# Patient Record
Sex: Female | Born: 1955 | Race: White | Hispanic: No | Marital: Single | Smoking: Never smoker
Health system: Southern US, Community
[De-identification: ages and names within clinical notes are randomized; demographics above are authoritative.]

## PROBLEM LIST (undated history)

## (undated) DIAGNOSIS — R1031 Right lower quadrant pain: Secondary | ICD-10-CM

## (undated) DIAGNOSIS — I10 Essential (primary) hypertension: Secondary | ICD-10-CM

## (undated) DIAGNOSIS — E039 Hypothyroidism, unspecified: Secondary | ICD-10-CM

## (undated) DIAGNOSIS — M1990 Unspecified osteoarthritis, unspecified site: Secondary | ICD-10-CM

## (undated) DIAGNOSIS — J302 Other seasonal allergic rhinitis: Secondary | ICD-10-CM

## (undated) DIAGNOSIS — L309 Dermatitis, unspecified: Secondary | ICD-10-CM

## (undated) DIAGNOSIS — E7800 Pure hypercholesterolemia, unspecified: Secondary | ICD-10-CM

## (undated) HISTORY — PX: OTHER SURGICAL HISTORY: SHX169

## (undated) HISTORY — PX: CARPAL TUNNEL RELEASE: SHX101

## (undated) HISTORY — DX: Essential (primary) hypertension: I10

## (undated) HISTORY — PX: THYROID SURGERY: SHX805

## (undated) HISTORY — DX: Other seasonal allergic rhinitis: J30.2

## (undated) HISTORY — DX: Pure hypercholesterolemia, unspecified: E78.00

## (undated) HISTORY — PX: TONSILLECTOMY: SUR1361

---

## 1998-10-16 ENCOUNTER — Encounter: Payer: Self-pay | Admitting: Neurosurgery

## 1998-10-16 ENCOUNTER — Ambulatory Visit (HOSPITAL_COMMUNITY): Admission: RE | Admit: 1998-10-16 | Discharge: 1998-10-16 | Payer: Self-pay | Admitting: Neurosurgery

## 2004-12-20 ENCOUNTER — Ambulatory Visit: Payer: Self-pay | Admitting: Obstetrics and Gynecology

## 2006-01-03 ENCOUNTER — Ambulatory Visit: Payer: Self-pay | Admitting: Obstetrics and Gynecology

## 2007-01-08 ENCOUNTER — Ambulatory Visit: Payer: Self-pay | Admitting: Obstetrics and Gynecology

## 2007-09-07 ENCOUNTER — Ambulatory Visit: Payer: Self-pay | Admitting: Otolaryngology

## 2007-12-04 ENCOUNTER — Ambulatory Visit: Payer: Self-pay | Admitting: Unknown Physician Specialty

## 2008-09-28 ENCOUNTER — Ambulatory Visit: Payer: Self-pay | Admitting: Family Medicine

## 2008-10-04 ENCOUNTER — Ambulatory Visit: Payer: Self-pay | Admitting: Family Medicine

## 2010-03-12 ENCOUNTER — Ambulatory Visit: Payer: Self-pay | Admitting: Family Medicine

## 2011-03-27 ENCOUNTER — Ambulatory Visit: Payer: Self-pay | Admitting: Family Medicine

## 2012-04-14 ENCOUNTER — Ambulatory Visit: Payer: Self-pay | Admitting: Family Medicine

## 2013-01-22 ENCOUNTER — Ambulatory Visit: Payer: Self-pay | Admitting: Unknown Physician Specialty

## 2013-01-25 LAB — PATHOLOGY REPORT

## 2013-05-04 ENCOUNTER — Ambulatory Visit: Payer: Self-pay | Admitting: Family Medicine

## 2014-05-05 ENCOUNTER — Ambulatory Visit: Payer: Self-pay | Admitting: Family Medicine

## 2014-08-18 ENCOUNTER — Encounter: Payer: Self-pay | Admitting: Pulmonary Disease

## 2014-08-18 ENCOUNTER — Ambulatory Visit (INDEPENDENT_AMBULATORY_CARE_PROVIDER_SITE_OTHER): Payer: BLUE CROSS/BLUE SHIELD | Admitting: Pulmonary Disease

## 2014-08-18 ENCOUNTER — Ambulatory Visit: Payer: Self-pay | Admitting: Pulmonary Disease

## 2014-08-18 ENCOUNTER — Encounter (INDEPENDENT_AMBULATORY_CARE_PROVIDER_SITE_OTHER): Payer: Self-pay

## 2014-08-18 VITALS — BP 134/82 | HR 103 | Ht 64.0 in | Wt 154.0 lb

## 2014-08-18 DIAGNOSIS — R0602 Shortness of breath: Secondary | ICD-10-CM

## 2014-08-18 DIAGNOSIS — R9389 Abnormal findings on diagnostic imaging of other specified body structures: Secondary | ICD-10-CM | POA: Insufficient documentation

## 2014-08-18 DIAGNOSIS — R059 Cough, unspecified: Secondary | ICD-10-CM

## 2014-08-18 DIAGNOSIS — R05 Cough: Secondary | ICD-10-CM | POA: Insufficient documentation

## 2014-08-18 DIAGNOSIS — R0600 Dyspnea, unspecified: Secondary | ICD-10-CM

## 2014-08-18 MED ORDER — HYDROCOD POLST-CHLORPHEN POLST 10-8 MG/5ML PO LQCR: 5.0000 mL | Freq: Two times a day (BID) | ORAL | Status: DC | PRN

## 2014-08-18 NOTE — Assessment & Plan Note (Signed)
She describes slight exertional dyspnea over the last several weeks associated with this cough. Her lung exam was abnormal with coarse crackles in the left lung. Her ambulatory oximetry was normal. Considering her lengthy history of recurrent episodes of pneumonia and bronchitis over the years I am first concerned that she could be experiencing another episode of a respiratory infection, but I am concerned about the possibility of either an underlying interstitial lung disease or bronchiectasis.   Plan:  -obtain full pulmonary function testing today -Chest x-ray today  -May need CT chest, we'll follow-up the results of the pulmonary function test and the chest x-ray first

## 2014-08-18 NOTE — Patient Instructions (Signed)
We will arrange a chest x-ray, pulmonary function test, and possibly a CT scan (depending on the two results). We will see you back in clinic next week. Use the Tussionex to help with the cough.  Beware this has a narcotic so you should not take this and drive

## 2014-08-18 NOTE — Progress Notes (Signed)
PFT performed today. 

## 2014-08-18 NOTE — Assessment & Plan Note (Signed)
Believe that part of her cough is related to laryngeal irritation from frequent coughing and she would benefit from voice rest and cough suppression. However, I'm very concerned about her lung exam as she had coarse crackles in the left lung greater than the right. She also describes exertional dyspnea as well as low oxygen saturation measured at home. This bothers me and raises concern for an underlying lung condition.  Plan: -For now we will use test next as needed for cough -Voice rest was encouraged -See below

## 2014-08-18 NOTE — Progress Notes (Signed)
Subjective:    Patient ID: Jill Crawford, female    DOB: [birth year], 59 y.o.   MRN: [id number]  HPI  Chief Complaint  Patient presents with  . Advice Only    Referred by Dr. Elenore Rota for chronic cough X5 months.  Pt c/o worsening sometimes prod cough with yellow mucus, fatigue, lack of appetite, headache.    Jill Crawford has been suffering with a bad cough for the last five months. She said that in [place] she had a sinus infection that led to a respiratory infection.  She went to a physician and was given antibiotics on two separate occassions which only mildly helped.  She was initially given doxycycline and then a zpack and then later bactrim.  She was allergic to the bactrim.  She was also prescribed proair and prednisone which did not help.  She has seen Dr. Delfin Edis with ENT and has been treated with   She says that eating makes the cough worse, especially when she eats her dry cereal in the monring.  Even just eating soup will make it worse.  Bending over makes it worse.  Coughing makes her throw up and urinate when coughing.  She coughs all night. She denies sinus congestion.  She gets a lot of sinus congestion, watery eyes when this happens.  For the last two weeks the cough has been dry but initially she produced mucus.  She gets short of breath and sometimes dizzy when it happens. She said that once she noticed that her oxygen level drops to 88% when this happens.  She has been using an oximeter at home and has seen her O2 as low as 88% lately when not cougihing and not having symptoms.    She works as an Airline pilot.  She has had acid reflux symptoms in the past but she really hasn't had much problems.    She says that water helps her calm her cough down.  No other foods seem to help.    She has repeated episodes of bronchitis and pneumonia over the years. It is often associated with pain in the sternum.  No lungs problems as a child or severe infections.  She says she only  smoked one week in her life.  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    Past Medical History  Diagnosis Date  . Hypertension   . Hypercholesteremia   . Seasonal allergies      Family History  Problem Relation Age of Onset  . Heart disease Father   . Colon cancer Father      History   Social History  . Marital Status: Single    Spouse Name: N/A    Number of Children: N/A  . Years of Education: N/A   Occupational History  . Not on file.   Social History Main Topics  . Smoking status: Never Smoker   . Smokeless tobacco: Never Used     Comment: exposed to secondhand smoke growing up.   . Alcohol Use: Not on file  . Drug Use: Not on file  . Sexual Activity: Not on file   Other Topics Concern  . Not on file   Social History Narrative  . No narrative on file     Allergies  Allergen Reactions  . Levaquin [Levofloxacin In D5w]     itching  . Penicillins     Serum sickness  . Sulfa Antibiotics     Hives, itching, swelling  .  Aspirin     Sensitive in large doses     No outpatient prescriptions prior to visit.   No facility-administered medications prior to visit.      Review of Systems  Constitutional: Positive for fatigue. Negative for fever and unexpected weight change.  HENT: Positive for congestion. Negative for dental problem, ear pain, nosebleeds, postnasal drip, rhinorrhea, sinus pressure, sneezing, sore throat and trouble swallowing.   Eyes: Negative for redness and itching.  Respiratory: Positive for cough. Negative for chest tightness, shortness of breath and wheezing.   Cardiovascular: Negative for palpitations and leg swelling.  Gastrointestinal: Negative for nausea and vomiting.  Genitourinary: Negative for dysuria.  Musculoskeletal: Negative for joint swelling.  Skin: Negative for rash.  Neurological: Negative for headaches.  Hematological: Does not bruise/bleed easily.  Psychiatric/Behavioral: Negative for  dysphoric mood. The patient is not nervous/anxious.        Objective:   Physical Exam Filed Vitals:   08/18/14 1102  BP: 134/82  Pulse: 103  Height:  (1.626 m)  Weight: 154 lb (69.854 kg)  SpO2: 94%   Ambulated 500 feet on room air and O2 saturation was normal  Gen: well appearing, no acute distress HEENT: NCAT, PERRL, EOMi, OP clear, neck supple without masses PULM: Coarse crackles left lung and right base CV: RRR, no mgr, no JVD AB: BS+, soft, nontender, no hsm Ext: warm, no edema, no clubbing, no cyanosis Derm: no rash or skin breakdown Neuro: A&Ox4, CN II-XII intact, strength 5/5 in all 4 extremities  Records from [place] ear nose and throat were reviewed today in clinic       Assessment & Plan:   Cough Believe that part of her cough is related to laryngeal irritation from frequent coughing and she would benefit from voice rest and cough suppression. However, I'm very concerned about her lung exam as she had coarse crackles in the left lung greater than the right. She also describes exertional dyspnea as well as low oxygen saturation measured at home. This bothers me and raises concern for an underlying lung condition.  Plan: -For now we will use test next as needed for cough -Voice rest was encouraged -See below   Dyspnea She describes slight exertional dyspnea over the last several weeks associated with this cough. Her lung exam was abnormal with coarse crackles in the left lung. Her ambulatory oximetry was normal. Considering her lengthy history of recurrent episodes of pneumonia and bronchitis over the years I am first concerned that she could be experiencing another episode of a respiratory infection, but I am concerned about the possibility of either an underlying interstitial lung disease or bronchiectasis.   Plan:  -obtain full pulmonary function testing today -Chest x-ray today  -May need CT chest, we'll follow-up the results of the pulmonary function  test and the chest x-ray first     Updated Medication List Outpatient Encounter Prescriptions as of 08/18/2014  Medication Sig  . Acetylcarnitine HCl (ACETYL L-CARNITINE) 500 MG CAPS Take 1 capsule by mouth daily.  Marland Kitchen albuterol (PROVENTIL HFA;VENTOLIN HFA) 108 (90 BASE) MCG/ACT inhaler Inhale 2 puffs into the lungs every 6 (six) hours as needed for wheezing or shortness of breath.  . Alpha-Lipoic Acid 200 MG CAPS Take 1 capsule by mouth daily.  Marland Kitchen aspirin 81 MG tablet Take 81 mg by mouth daily.  . Calcium Carbonate-Vitamin D (CALCIUM-VITAMIN D) 500-200 MG-UNIT per tablet Take 1 tablet by mouth 2 (two) times daily.  . cetirizine (ZYRTEC) 10 MG tablet Take  10 mg by mouth daily.  . Cholecalciferol (VITAMIN D3) 2000 UNITS TABS Take 1 tablet by mouth daily.  . Coenzyme Q10 (CO Q 10) 100 MG CAPS Take 1 capsule by mouth daily.  . Flaxseed, Linseed, (FLAXSEED OIL) 1000 MG CAPS Take 1 capsule by mouth daily.  . Garlic 1000 MG CAPS Take 1 capsule by mouth daily.  . Grape Seed Extract 100 MG CAPS Take 1 capsule by mouth daily.  Marland Kitchen. L-Lysine 500 MG TABS Take 1 tablet by mouth daily.  Marland Kitchen. levothyroxine (SYNTHROID, LEVOTHROID) 25 MCG tablet Take 25 mcg by mouth daily before breakfast.  . Magnesium 400 MG CAPS Take 1 capsule by mouth daily.  . mometasone (NASONEX) 50 MCG/ACT nasal spray Place 2 sprays into the nose daily.  . Multiple Vitamin (MULTIVITAMIN WITH MINERALS) TABS tablet Take 1 tablet by mouth daily.  . Omega-3 Fatty Acids (FISH OIL) 1200 MG CAPS Take 1 capsule by mouth daily.  Marland Kitchen. omeprazole (PRILOSEC) 40 MG capsule Take 40 mg by mouth 2 (two) times daily.  . rosuvastatin (CRESTOR) 10 MG tablet Take 10 mg by mouth daily.  . traMADol (ULTRAM) 50 MG tablet Take 50 mg by mouth every 6 (six) hours as needed.  . verapamil (VERELAN PM) 240 MG 24 hr capsule Take 240 mg by mouth 2 (two) times daily.  . vitamin C (ASCORBIC ACID) 500 MG tablet Take 500 mg by mouth daily.  . chlorpheniramine-HYDROcodone  (TUSSIONEX PENNKINETIC ER) 10-8 MG/5ML LQCR Take 5 mLs by mouth every 12 (twelve) hours as needed for cough.

## 2014-08-23 ENCOUNTER — Ambulatory Visit (INDEPENDENT_AMBULATORY_CARE_PROVIDER_SITE_OTHER): Payer: BLUE CROSS/BLUE SHIELD | Admitting: Pulmonary Disease

## 2014-08-23 ENCOUNTER — Telehealth: Payer: Self-pay | Admitting: Pulmonary Disease

## 2014-08-23 ENCOUNTER — Encounter: Payer: Self-pay | Admitting: Pulmonary Disease

## 2014-08-23 VITALS — BP 134/78 | HR 96 | Ht 64.0 in | Wt 154.0 lb

## 2014-08-23 DIAGNOSIS — R059 Cough, unspecified: Secondary | ICD-10-CM

## 2014-08-23 DIAGNOSIS — R05 Cough: Secondary | ICD-10-CM

## 2014-08-23 DIAGNOSIS — R0600 Dyspnea, unspecified: Secondary | ICD-10-CM

## 2014-08-23 MED ORDER — FLUTTER DEVI: Status: DC

## 2014-08-23 MED ORDER — TRAMADOL HCL 50 MG PO TABS: 50.0000 mg | ORAL_TABLET | Freq: Four times a day (QID) | ORAL | Status: DC | PRN

## 2014-08-23 NOTE — Assessment & Plan Note (Signed)
Though she had a fairly normal chest x-ray (acute bronchitis noted), and lung function testing her pulmonary exam remains abnormal. Because of her repeated episodes of bronchitis over the years and persistence of her current symptom of cough I worry about the possibility of bronchiectasis. Further, I worry about the possibility of an atypical mycobacterial infection.  Plan: -Obtain sputum for AFB culture 3 as well as bacterial and fungal culture -High-resolution CT chest to look for bronchiectasis -Use tramadol as needed for cough -Voice rest again encourage -Flutter valve twice a day to encourage mucus production -Follow-up 2-6 weeks depending on result of the CT chest

## 2014-08-23 NOTE — Telephone Encounter (Signed)
lmtcb X1 for pt.  Fyi: flutter valve has already been set aside with rx and [facility] form filled out.  [facility] [place] about taking this to [facility] on Thursday afternoon.

## 2014-08-23 NOTE — Progress Notes (Signed)
Subjective:    Patient ID: Jill Crawford, female    DOB: 04/16/1956, 59 y.o.   MRN: [id number]    Synopsis: Jill Crawford [facility] for evaluation of cough and dyspnea.  She had a history of recurrent bronchitis and a very minimal smoking history (less than one week in college).  PFT in 2016 was normal, CXR showed bronchitis.  HPI Chief Complaint  Patient presents with  . Follow-up    review pft.  pt states her sob is the same.     Jill Crawford continues to cough unfortunatley and she says that the dyspnea is unchanged.  She felt a little better after using the tussionex but then the cough returned.  She continues to have mucus production which is yellow and non-bloody.  No fever or chills.    Past Medical History  Diagnosis Date  . Hypertension   . Hypercholesteremia   . Seasonal allergies       Review of Systems     Objective:   Physical Exam  Filed Vitals:   08/23/14 1324  BP: 134/78  Pulse: 96  Height: 5\' 4"  (1.626 m)  Weight: 154 lb (69.854 kg)  SpO2: 94%  RA  Gen: well appearing, no acute distress HEENT: NCAT,  EOMi, OP clear,  PULM: Coarse crackles bilaterally  CV: RRR, no mgr, no JVD AB: BS+, soft, nontender,  Ext: warm, no edema, no clubbing, no cyanosis Derm: no rash or skin breakdown Neuro: A&Ox4, CN II-XII intact, MAEW       Assessment & Plan:   Dyspnea Though she had a fairly normal chest x-ray (acute bronchitis noted), and lung function testing her pulmonary exam remains abnormal. Because of her repeated episodes of bronchitis over the years and persistence of her current symptom of cough I worry about the possibility of bronchiectasis. Further, I worry about the possibility of an atypical mycobacterial infection.  Plan: -Obtain sputum for AFB culture 3 as well as bacterial and fungal culture -High-resolution CT chest to look for bronchiectasis -Use tramadol as needed for cough -Voice rest  again encourage -Flutter valve twice a day to encourage mucus production -Follow-up 2-6 weeks depending on result of the CT chest     Updated Medication List Outpatient Encounter Prescriptions as of 08/23/2014  Medication Sig  . Acetylcarnitine HCl (ACETYL L-CARNITINE) 500 MG CAPS Take 1 capsule by mouth daily.  Marland Kitchen. albuterol (PROVENTIL HFA;VENTOLIN HFA) 108 (90 BASE) MCG/ACT inhaler Inhale 2 puffs into the lungs every 6 (six) hours as needed for wheezing or shortness of breath.  . Alpha-Lipoic Acid 200 MG CAPS Take 1 capsule by mouth daily.  Marland Kitchen. aspirin 81 MG tablet Take 81 mg by mouth daily.  . Calcium Carbonate-Vitamin D (CALCIUM-VITAMIN D) 500-200 MG-UNIT per tablet Take 1 tablet by mouth 2 (two) times daily.  . cetirizine (ZYRTEC) 10 MG tablet Take 10 mg by mouth daily.  . chlorpheniramine-HYDROcodone (TUSSIONEX PENNKINETIC ER) 10-8 MG/5ML LQCR Take 5 mLs by mouth every 12 (twelve) hours as needed for cough.  . Cholecalciferol (VITAMIN D3) 2000 UNITS TABS Take 1 tablet by mouth daily.  . Coenzyme Q10 (CO Q 10) 100 MG CAPS Take 1 capsule by mouth daily.  . Flaxseed, Linseed, (FLAXSEED OIL) 1000 MG CAPS Take 1 capsule by mouth daily.  . Garlic 1000 MG CAPS Take 1 capsule by mouth daily.  . Grape Seed Extract 100 MG CAPS Take 1 capsule by mouth daily.  Marland Kitchen. L-Lysine 500  MG TABS Take 1 tablet by mouth daily.  Marland Kitchen levothyroxine (SYNTHROID, LEVOTHROID) 25 MCG tablet Take 25 mcg by mouth daily before breakfast.  . Magnesium 400 MG CAPS Take 1 capsule by mouth daily.  . mometasone (NASONEX) 50 MCG/ACT nasal spray Place 2 sprays into the nose daily.  . Multiple Vitamin (MULTIVITAMIN WITH MINERALS) TABS tablet Take 1 tablet by mouth daily.  . Omega-3 Fatty Acids (FISH OIL) 1200 MG CAPS Take 1 capsule by mouth daily.  Marland Kitchen omeprazole (PRILOSEC) 40 MG capsule Take 40 mg by mouth 2 (two) times daily.  . rosuvastatin (CRESTOR) 10 MG tablet Take 10 mg by mouth daily.  . traMADol (ULTRAM) 50 MG tablet Take 50  mg by mouth every 6 (six) hours as needed.  . verapamil (VERELAN PM) 240 MG 24 hr capsule Take 240 mg by mouth 2 (two) times daily.  . vitamin C (ASCORBIC ACID) 500 MG tablet Take 500 mg by mouth daily.  . traMADol (ULTRAM) 50 MG tablet Take 1 tablet (50 mg total) by mouth every 6 (six) hours as needed.

## 2014-08-23 NOTE — Patient Instructions (Signed)
We will order a CT scan of your chest Use the Tramadol as needed for cough Bring us three separate samples of mucus We will see you back in 2 weeks, or 6 weeks if the CT scan is normal

## 2014-08-24 NOTE — Telephone Encounter (Signed)
Message left that  office will have am clinic on Thursday 08/25/14 and not pm clinic. Patient may come by Thurs. Am to pick up flutter device.

## 2014-08-24 NOTE — Telephone Encounter (Signed)
Spoke with pt, she will be picking up flutter valve from [facility] office tomorrow afternoon.  [facility] form [facility]  Forwarding to [facility] just to make her aware.

## 2014-08-24 NOTE — Telephone Encounter (Signed)
pt returned call [phone]

## 2014-08-25 ENCOUNTER — Ambulatory Visit: Payer: Self-pay | Admitting: Pulmonary Disease

## 2014-08-25 NOTE — Telephone Encounter (Signed)
Spoke with pt.  She picked the flutter valve up yesterday and was instructed how to use this by [place].  Pt reports nothing further needed at this time.

## 2014-08-26 LAB — PULMONARY FUNCTION TEST
DL/VA % pred: 107 %
DL/VA: 5.16 ml/min/mmHg/L
DLCO unc % pred: 80 %
DLCO unc: 19.55 ml/min/mmHg
FEF 25-75 PRE: 4.2 L/s
FEF 25-75 Post: 3.71 L/sec
FEF2575-%CHANGE-POST: -11 %
FEF2575-%PRED-POST: 150 %
FEF2575-%Pred-Pre: 170 %
FEV1-%Change-Post: 0 %
FEV1-%PRED-POST: 79 %
FEV1-%Pred-Pre: 79 %
FEV1-PRE: 2.1 L
FEV1-Post: 2.1 L
FEV1FVC-%Change-Post: 0 %
FEV1FVC-%PRED-PRE: 117 %
FEV6-%Change-Post: -1 %
FEV6-%PRED-POST: 67 %
FEV6-%PRED-PRE: 68 %
FEV6-Post: 2.22 L
FEV6-Pre: 2.25 L
FEV6FVC-%PRED-POST: 103 %
FEV6FVC-%PRED-PRE: 103 %
FVC-%CHANGE-POST: 0 %
FVC-%Pred-Post: 66 %
FVC-%Pred-Pre: 66 %
FVC-Post: 2.23 L
FVC-Pre: 2.25 L
PRE FEV1/FVC RATIO: 93 %
PRE FEV6/FVC RATIO: 100 %
Post FEV1/FVC ratio: 94 %
Post FEV6/FVC ratio: 100 %
RV % PRED: 83 %
RV: 1.61 L
TLC % PRED: 83 %
TLC: 4.19 L

## 2014-09-05 ENCOUNTER — Other Ambulatory Visit: Payer: Self-pay | Admitting: Pulmonary Disease

## 2014-09-05 ENCOUNTER — Other Ambulatory Visit: Payer: Self-pay | Admitting: *Deleted

## 2014-09-05 DIAGNOSIS — R059 Cough, unspecified: Secondary | ICD-10-CM

## 2014-09-05 DIAGNOSIS — R05 Cough: Secondary | ICD-10-CM

## 2014-09-07 ENCOUNTER — Telehealth: Payer: Self-pay

## 2014-09-07 NOTE — Telephone Encounter (Signed)
-----   Message from Lupita Leashouglas B McQuaid, MD sent at 09/05/2014  7:32 AM EST ----- A, Please let her know that her CT chest shows some scarring in her lungs and I want her to have my ILD panel (just tell her blood work to figure out what is going on) before the next visit. Thanks B

## 2014-09-07 NOTE — Telephone Encounter (Signed)
Pt aware of results and recs.  Lab appt scheduled for next Friday in [facility] at 8:30.    BQ what labs specifically do you want for her?  Also, pt wants us to mail her a rx for tussionex to last her until this next visit.  Are you ok with this refill?  Thanks!

## 2014-09-08 LAB — RESPIRATORY CULTURE OR RESPIRATORY AND SPUTUM CULTURE
CULTURE: NORMAL
Organism ID, Bacteria: NORMAL

## 2014-09-08 NOTE — Telephone Encounter (Signed)
I'm OK with tussionex refill I was going to order my ILD panel of orders in my favorites.  I thought you have access to that. If not I can order them, but let me know how/where because the ordersets are different for [place] and [place].

## 2014-09-12 ENCOUNTER — Telehealth: Payer: Self-pay | Admitting: Pulmonary Disease

## 2014-09-12 DIAGNOSIS — R05 Cough: Secondary | ICD-10-CM

## 2014-09-12 DIAGNOSIS — R059 Cough, unspecified: Secondary | ICD-10-CM

## 2014-09-12 MED ORDER — TRAMADOL HCL 50 MG PO TABS: 50.0000 mg | ORAL_TABLET | Freq: Four times a day (QID) | ORAL | Status: DC | PRN

## 2014-09-12 MED ORDER — HYDROCOD POLST-CHLORPHEN POLST 10-8 MG/5ML PO LQCR: 5.0000 mL | Freq: Two times a day (BID) | ORAL | Status: DC | PRN

## 2014-09-12 NOTE — Telephone Encounter (Signed)
atc pt, ,line was answered and I was put on hold but line was disconnected.  Wcb.

## 2014-09-12 NOTE — Telephone Encounter (Signed)
OK by me 

## 2014-09-12 NOTE — Telephone Encounter (Signed)
Pt returning call. Please call between 4 & 5. She is in a meeting. Ph #  [phone]

## 2014-09-12 NOTE — Telephone Encounter (Signed)
Spoke with pt, she is aware of recs.  Called med in to pharmacy.  Nothing further needed.

## 2014-09-12 NOTE — Telephone Encounter (Signed)
I can order these labs, I just don't have access to that panel on your favorites.  Dr. Craige CottaSood, since BQ is not in the office are you ok with signing this tussionex refill?  Thanks!

## 2014-09-12 NOTE — Telephone Encounter (Signed)
Patient needs refill of Tramadol.  Patient has appointment on 09/22/2014.  Patient has been taking 2 tablets daily to stretch out the medication, but she only has 2 pills left now which will not last long enough.  She would like to have 14 tablets to hold her over until her next OV.  Patient would like RX mailed to her.

## 2014-09-12 NOTE — Telephone Encounter (Signed)
I can sign script for tussionex.  Please print for me to sign.

## 2014-09-12 NOTE — Telephone Encounter (Signed)
tussionex has been mailed to pt.

## 2014-09-15 ENCOUNTER — Encounter: Payer: Self-pay | Admitting: Pulmonary Disease

## 2014-09-16 ENCOUNTER — Other Ambulatory Visit: Payer: BLUE CROSS/BLUE SHIELD

## 2014-09-16 DIAGNOSIS — R05 Cough: Secondary | ICD-10-CM

## 2014-09-16 DIAGNOSIS — R059 Cough, unspecified: Secondary | ICD-10-CM

## 2014-09-17 LAB — ANTI-JO 1 ANTIBODY, IGG

## 2014-09-17 LAB — RHEUMATOID FACTOR: Rhuematoid fact SerPl-aCnc: 24 IU/mL — ABNORMAL HIGH (ref <=14)

## 2014-09-19 LAB — ANA: Anti Nuclear Antibody(ANA): NEGATIVE

## 2014-09-19 LAB — SJOGRENS SYNDROME-B EXTRACTABLE NUCLEAR ANTIBODY: SSB (LA) (ENA) ANTIBODY, IGG: NEGATIVE

## 2014-09-19 LAB — SJOGRENS SYNDROME-A EXTRACTABLE NUCLEAR ANTIBODY: SSA (RO) (ENA) ANTIBODY, IGG: NEGATIVE

## 2014-09-19 LAB — ANTI-SCLERODERMA ANTIBODY: Scleroderma (Scl-70) (ENA) Antibody, IgG: <1

## 2014-09-19 LAB — CENTROMERE ANTIBODIES: CENTROMERE AB SCREEN: NEGATIVE

## 2014-09-19 LAB — ALDOLASE: Aldolase: 6.9 U/L (ref <=8.1)

## 2014-09-22 ENCOUNTER — Other Ambulatory Visit: Payer: BLUE CROSS/BLUE SHIELD

## 2014-09-22 ENCOUNTER — Encounter: Payer: Self-pay | Admitting: Pulmonary Disease

## 2014-09-22 ENCOUNTER — Ambulatory Visit (INDEPENDENT_AMBULATORY_CARE_PROVIDER_SITE_OTHER): Payer: BLUE CROSS/BLUE SHIELD | Admitting: Pulmonary Disease

## 2014-09-22 VITALS — BP 116/68 | HR 89 | Ht 64.0 in | Wt 151.0 lb

## 2014-09-22 DIAGNOSIS — J849 Interstitial pulmonary disease, unspecified: Secondary | ICD-10-CM

## 2014-09-22 DIAGNOSIS — R05 Cough: Secondary | ICD-10-CM

## 2014-09-22 DIAGNOSIS — R0600 Dyspnea, unspecified: Secondary | ICD-10-CM

## 2014-09-22 DIAGNOSIS — R059 Cough, unspecified: Secondary | ICD-10-CM

## 2014-09-22 MED ORDER — GABAPENTIN 100 MG PO CAPS: ORAL_CAPSULE | ORAL | Status: DC

## 2014-09-22 MED ORDER — HYDROCOD POLST-CHLORPHEN POLST 10-8 MG/5ML PO LQCR: 5.0000 mL | Freq: Two times a day (BID) | ORAL | Status: DC | PRN

## 2014-09-22 NOTE — Progress Notes (Signed)
Subjective:    Patient ID: Jill Crawford, female    DOB: December 27, 1955, 59 y.o.   MRN: [id number]    Synopsis: Jill Crawford was referred to the [facility] pulmonary clinic in 2016 for evaluation of cough and dyspnea.  She had a history of recurrent bronchitis and a very minimal smoking history (less than one week in college).  PFT in 2016 was normal, CXR showed bronchitis.  08/2014 CT chest ([place] read)> patchy areas of peribronchovascular thickening and ggo worrisome for NSIP, doesn't look like UIP but there is a strong cranio-caudal gradient, mild bronchiectasis   HPI Chief Complaint  Patient presents with  . Follow-up    review ct chest.  pt c/o stable sob, dizziness.     Jill Crawford is still coughing up green to yellow phlegm which comes up from time to time.  She says that she is not better since I have met her.  The tussionex and tramadol help with the cough but she thinks that the medications are making her dizzy.  She has fatigue if she does too much.  No fever or chills.  No trouble swallowing, never coughs up blood.  She will get a bloody nose from time to time.  She will get sinus congestion from time to time.  And an occasional nose bleed from time to time.  She does not feel chest pain or significant dyspnea.    Past Medical History  Diagnosis Date  . Hypertension   . Hypercholesteremia   . Seasonal allergies      Review of Systems  Constitutional: Positive for fatigue. Negative for fever and chills.  HENT: Negative for rhinorrhea, sinus pressure and sneezing.   Respiratory: Positive for cough and shortness of breath. Negative for wheezing.   Cardiovascular: Negative for chest pain, palpitations and leg swelling.       Objective:   Physical Exam  Filed Vitals:   09/22/14 1338  BP: 116/68  Pulse: 89  Height: '5\' 4"'  (1.626 m)  Weight: 151 lb (68.493 kg)  SpO2: 94%  RA  Gen: well appearing, no acute distress HEENT: NCAT,  EOMi, OP clear,  PULM: Coarse  crackles bilaterally in bases CV: RRR, no mgr, no JVD AB: BS+, soft, nontender,  Ext: warm, no edema, no clubbing, no cyanosis Derm: no rash or skin breakdown Neuro: A&Ox4, CN II-XII intact, MAEW       Assessment & Plan:   Cough Sky remains quite frustrated with her cough. I don't think she's made much effort and voice rest. I remain concerned that there is an underlying pulmonary disease which is contributing to this, see below.  Plan: -continue voice rest -Continue Tesson night when necessary  -continue workup for interstitial lung disease as detailed below.   Dyspnea I have reviewed the images from the CT chest performed in February 2016 which was worrisome for possible nonspecific interstitial pneumonitis. On my review I see scattered tree in bud abnormalities as well as mild interstitial thickening which seems to be worse in the bases of the lungs bilaterally. Generally the differential diagnosis for a CT like this is aspiration, sarcoidosis, or less likely atypical mycobacterial disease. Because she has been producing significant amounts of mucus for the past several months I remain concerned for the possibility of atypical mycobacterial disease and I am awaiting the results of the 3 sputum cultures which we sent last month.  So far her connective tissue disease labs have been negative.  Also, considering the fact she has  persistent sinus symptoms as well as occasional epistaxis I would like to send ANCA testing today to rule out small vessel vasculitis.  Plan: -ANCA  -If mucus culture does not grow out AFB then we will perform a bronchoscopy with transbronchial biopsies and BAL to evaluate for granulomatous disease     Updated Medication List Outpatient Encounter Prescriptions as of 09/22/2014  Medication Sig  . Acetylcarnitine HCl (ACETYL L-CARNITINE) 500 MG CAPS Take 1 capsule by mouth daily.  Marland Kitchen albuterol (PROVENTIL HFA;VENTOLIN HFA) 108 (90 BASE) MCG/ACT inhaler  Inhale 2 puffs into the lungs every 6 (six) hours as needed for wheezing or shortness of breath.  . Alpha-Lipoic Acid 200 MG CAPS Take 1 capsule by mouth daily.  Marland Kitchen aspirin 81 MG tablet Take 81 mg by mouth daily.  . Calcium Carbonate-Vitamin D (CALCIUM-VITAMIN D) 500-200 MG-UNIT per tablet Take 1 tablet by mouth 2 (two) times daily.  . cetirizine (ZYRTEC) 10 MG tablet Take 10 mg by mouth daily.  . chlorpheniramine-HYDROcodone (TUSSIONEX PENNKINETIC ER) 10-8 MG/5ML LQCR Take 5 mLs by mouth every 12 (twelve) hours as needed for cough.  . Cholecalciferol (VITAMIN D3) 2000 UNITS TABS Take 1 tablet by mouth daily.  . Coenzyme Q10 (CO Q 10) 100 MG CAPS Take 1 capsule by mouth daily.  . Flaxseed, Linseed, (FLAXSEED OIL) 1000 MG CAPS Take 1 capsule by mouth daily.  . Garlic 6147 MG CAPS Take 1 capsule by mouth daily.  . Grape Seed Extract 100 MG CAPS Take 1 capsule by mouth daily.  Marland Kitchen L-Lysine 500 MG TABS Take 1 tablet by mouth daily.  Marland Kitchen levothyroxine (SYNTHROID, LEVOTHROID) 25 MCG tablet Take 25 mcg by mouth daily before breakfast.  . Magnesium 400 MG CAPS Take 1 capsule by mouth daily.  . mometasone (NASONEX) 50 MCG/ACT nasal spray Place 2 sprays into the nose daily.  . Multiple Vitamin (MULTIVITAMIN WITH MINERALS) TABS tablet Take 1 tablet by mouth daily.  . Omega-3 Fatty Acids (FISH OIL) 1200 MG CAPS Take 1 capsule by mouth daily.  Marland Kitchen omeprazole (PRILOSEC) 40 MG capsule Take 40 mg by mouth 2 (two) times daily.  Marland Kitchen Respiratory Therapy Supplies (FLUTTER) DEVI Use daily as directed  . rosuvastatin (CRESTOR) 10 MG tablet Take 10 mg by mouth daily.  . traMADol (ULTRAM) 50 MG tablet Take 50 mg by mouth every 6 (six) hours as needed.  . verapamil (VERELAN PM) 240 MG 24 hr capsule Take 240 mg by mouth 2 (two) times daily.  . vitamin C (ASCORBIC ACID) 500 MG tablet Take 500 mg by mouth daily.  . [DISCONTINUED] chlorpheniramine-HYDROcodone (TUSSIONEX PENNKINETIC ER) 10-8 MG/5ML LQCR Take 5 mLs by mouth every 12  (twelve) hours as needed for cough.  . gabapentin (NEURONTIN) 100 MG capsule Take 11m po tid for a week, then 2075mpo tid for a week, then 30033mid until you see me next  . [DISCONTINUED] traMADol (ULTRAM) 50 MG tablet Take 1 tablet (50 mg total) by mouth every 6 (six) hours as needed.

## 2014-09-22 NOTE — Assessment & Plan Note (Signed)
Jill PikesSusan remains quite frustrated with her cough. I don't think she's made much effort and voice rest. I remain concerned that there is an underlying pulmonary disease which is contributing to this, see below.  Plan: -continue voice rest -Continue Tesson night when necessary  -continue workup for interstitial lung disease as detailed below.

## 2014-09-22 NOTE — Patient Instructions (Signed)
Take the neurontin as directed, start with 100mg  three time a day for a week, then increase to 200mg  three times a day for a week, then 300mg  three times a day until you see me Gradually wean yourself off of the tramadol We will see you back in 6 weeks or sooner if needed

## 2014-09-22 NOTE — Assessment & Plan Note (Signed)
I have reviewed the images from the CT chest performed in February 2016 which was worrisome for possible nonspecific interstitial pneumonitis. On my review I see scattered tree in bud abnormalities as well as mild interstitial thickening which seems to be worse in the bases of the lungs bilaterally. Generally the differential diagnosis for a CT like this is aspiration, sarcoidosis, or less likely atypical mycobacterial disease. Because she has been producing significant amounts of mucus for the past several months I remain concerned for the possibility of atypical mycobacterial disease and I am awaiting the results of the 3 sputum cultures which we sent last month.  So far her connective tissue disease labs have been negative.  Also, considering the fact she has persistent sinus symptoms as well as occasional epistaxis I would like to send ANCA testing today to rule out small vessel vasculitis.  Plan: -ANCA  -If mucus culture does not grow out AFB then we will perform a bronchoscopy with transbronchial biopsies and BAL to evaluate for granulomatous disease

## 2014-09-23 ENCOUNTER — Encounter: Payer: Self-pay | Admitting: Pulmonary Disease

## 2014-09-23 LAB — HYPERSENSITIVITY PNUEMONITIS PROFILE

## 2014-09-26 LAB — ANCA SCREEN W REFLEX TITER
Atypical p-ANCA Screen: NEGATIVE
C-ANCA SCREEN: NEGATIVE
p-ANCA Screen: NEGATIVE

## 2014-09-28 ENCOUNTER — Telehealth: Payer: Self-pay

## 2014-09-28 ENCOUNTER — Other Ambulatory Visit (INDEPENDENT_AMBULATORY_CARE_PROVIDER_SITE_OTHER): Payer: BLUE CROSS/BLUE SHIELD

## 2014-09-28 DIAGNOSIS — R05 Cough: Secondary | ICD-10-CM

## 2014-09-28 DIAGNOSIS — R059 Cough, unspecified: Secondary | ICD-10-CM

## 2014-09-28 NOTE — Telephone Encounter (Signed)
Pt aware of results and recs.  [facility]  Lab ordered.  Nothing further needed at this time.

## 2014-09-28 NOTE — Telephone Encounter (Signed)
-----   Message from Lupita Leashouglas B McQuaid, MD sent at 09/27/2014  1:21 PM EDT ----- A, Please let her know that her blood work was OK with the exception of a weakly positive rheumatoid factor.  This is commonly a false positive.  In order to assess further I'd like her to have a anti-ccp antibody. Thanks B

## 2014-09-29 LAB — CYCLIC CITRUL PEPTIDE ANTIBODY, IGG: Cyclic Citrullin Peptide Ab: <2 U/mL (ref 0.0–5.0)

## 2014-09-30 ENCOUNTER — Telehealth: Payer: Self-pay | Admitting: Pulmonary Disease

## 2014-09-30 NOTE — Telephone Encounter (Signed)
A,    Please let her know that this was normal    Thanks    B    ----------------  Pt aware of results.

## 2014-10-03 LAB — FUNGUS CULTURE W SMEAR: SMEAR RESULT: NONE SEEN

## 2014-10-05 ENCOUNTER — Telehealth: Payer: Self-pay | Admitting: Pulmonary Disease

## 2014-10-05 NOTE — Telephone Encounter (Signed)
FYI - Dr. Kendrick FriesMcQuaid [employer] culture cannot be done at lab, had to be sent out.

## 2014-10-06 NOTE — Telephone Encounter (Signed)
OK 

## 2014-10-21 ENCOUNTER — Telehealth: Payer: Self-pay | Admitting: Pulmonary Disease

## 2014-10-21 ENCOUNTER — Telehealth: Payer: Self-pay | Admitting: *Deleted

## 2014-10-21 MED ORDER — GABAPENTIN 300 MG PO CAPS: 300.0000 mg | ORAL_CAPSULE | Freq: Three times a day (TID) | ORAL | Status: DC

## 2014-10-21 NOTE — Telephone Encounter (Signed)
Spoke with the pt  She states that she has increased to gabapentin to 100 mg 3 tabs tid  She needs refill - requesting 300 mg tablets  I was was to sent these in, b/c she states med is helping her cough (has not resolved, but improved a lot) Then she stated that the med was causing her feet to go numb at night, and her hand went number after 30 min of holding her phone  Please advise if you want her to remain on 300 mg tid

## 2014-10-21 NOTE — Telephone Encounter (Signed)
Spoke with Dr Kendrick FriesMcQuaid  He does not think that the med is causing this, they do not sound related  He is okay with sending in the 300 mg tid  Pt to keep planned f/u  Nothing further needed

## 2014-10-21 NOTE — Telephone Encounter (Signed)
Pharmacy sent request for new rx Gabapentin. Original rx was 100mg  gradually increased Pt now on 300 mg. Need to know how it is working. She does have enough to last until ov on 4/20 with BQ.

## 2014-10-23 LAB — AFB CULTURE WITH SMEAR (NOT AT ARMC): Acid Fast Smear: NONE SEEN

## 2014-11-03 ENCOUNTER — Encounter: Payer: Self-pay | Admitting: Pulmonary Disease

## 2014-11-03 ENCOUNTER — Ambulatory Visit (INDEPENDENT_AMBULATORY_CARE_PROVIDER_SITE_OTHER): Payer: BLUE CROSS/BLUE SHIELD | Admitting: Pulmonary Disease

## 2014-11-03 VITALS — BP 124/70 | HR 82 | Ht 64.0 in | Wt 151.0 lb

## 2014-11-03 DIAGNOSIS — R059 Cough, unspecified: Secondary | ICD-10-CM

## 2014-11-03 DIAGNOSIS — R9389 Abnormal findings on diagnostic imaging of other specified body structures: Secondary | ICD-10-CM

## 2014-11-03 DIAGNOSIS — R05 Cough: Secondary | ICD-10-CM | POA: Diagnosis not present

## 2014-11-03 DIAGNOSIS — R938 Abnormal findings on diagnostic imaging of other specified body structures: Secondary | ICD-10-CM | POA: Diagnosis not present

## 2014-11-03 NOTE — Assessment & Plan Note (Signed)
Her cough has improved. It sounds as if at this point she only has laryngeal irritation contributing to the cough. Her lung exam today was completely normal which is a marked improvement. I believe that this started with an atypical infection which led to persistent cough and laryngeal irritation. I have encouraged her to use allergy medications this time of year to prevent sinus symptoms from worsening or cough.   Plan: Because the cough has improved we decided to continue with Neurontin as prescribed at this point and I have encouraged her to wean herself off of the Tussionex because I believe it is decreasing her quality of sleep. However, if the cough has not resolved within the next 2 months then we can perform a bronchoscopy and consider enrolling her in a clinical trial.  Greater than 25 minutes were spent today in direct consultation with the patient and her mother answering questions regarding her cough and the findings from her CT scan.

## 2014-11-03 NOTE — Patient Instructions (Signed)
Wean yourself off the Tussionex over the next few days Continue using the tramadol on an as-needed basis as you're now Continue taking the Neurontin as you are doing Follow-up in 2 months or sooner

## 2014-11-03 NOTE — Assessment & Plan Note (Signed)
She had very mild changes of bronchiolectasis in nonspecific (but very mild interstitial changes) on her CT chest. The setting of no underlying connective tissue disease, completely normal pulmonary function testing, and improving symptoms, I think the likelihood of an underlying lung disease is low.  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  If she does not have improvement in cough over the next 2 months then we can perform a bronchoscopy with BAL to assess again with this infection. However this time considering her clinical improvement I do not think that is necessary.  Plan: -Continue close monitoring with every 6 month PFTs -Plan to repeat high-resolution CT scan in one year to ensure no evidence of interstitial lung disease -If no improvement in cough in 2 months and then perform bronchoscopy with BAL to assess for mycobacterial infection

## 2014-11-03 NOTE — Progress Notes (Signed)
Subjective:    Patient ID: Jill Crawford, female    DOB: 01/09/1956, 59 y.o.   MRN: [id number]    Synopsis: Jill Crawford was [facility] 2016 for evaluation of cough and dyspnea.  She had a history of recurrent bronchitis and a very minimal smoking history (less than one week in college).  PFT in 2016 was normal, CXR showed bronchitis.  08/2014 CT chest ([place] read)> patchy areas of peribronchovascular thickening and ggo worrisome for NSIP, doesn't look like UIP but there is a strong cranio-caudal gradient, mild bronchiectasis   HPI Chief Complaint  Patient presents with  . Follow-up    Pt states gabapentin helps to stop the coughing as well as the tramadol, but it's not completely gone.  Pt also having troubles from seasonal allergies.     Jill Crawford says that she is no better than previous.  However she says that the cough is not as frequent as before but she still has coughing attacks from time to time but they don't last as long as before and they are not as bad.  She still has some fatigue.  She occasionally coughs up mucus but not as bad as before.  Sometimes it is green, sometimes clear.  No fever or chills.    She is taking the neurontin now which has helped. She uses the tramadol a few times from time to time.  She typically uses 1/2 tab every two weeks.  She doesn't cough much at night anymore.  She is taking tussionex.   She is having more sinus congestion which is worse this time of year typically.    Past Medical History  Diagnosis Date  . Hypertension   . Hypercholesteremia   . Seasonal allergies      Review of Systems  Constitutional: Positive for fatigue. Negative for fever and chills.  HENT: Negative for rhinorrhea, sinus pressure and sneezing.   Respiratory: Positive for cough and shortness of breath. Negative for wheezing.   Cardiovascular: Negative for chest pain, palpitations and leg swelling.       Objective:   Physical Exam  Filed Vitals:   11/03/14 0902  BP: 124/70  Pulse: 82  Height: 5\' 4"  (1.626 m)  Weight: 151 lb (68.493 kg)  SpO2: 96%  RA  Gen: well appearing HENT: OP clear, TM's clear, neck supple PULM: CTA B, normal percussion CV: RRR, no mgr, trace edema GI: BS+, soft, nontender Derm: no cyanosis or rash Psyche: normal mood and affect  Images from the 2016 CT chest were reviewed again which showed very mild interstitial changes in the periphery of her lung and very mild bronchiolectasis, tree in bud abnormalities again seen   I reviewed the records from her current care office where she was found to have a hemoglobin of 15      Assessment & Plan:   Abnormal CT scan, chest She had very mild changes of bronchiolectasis in nonspecific (but very mild interstitial changes) on her CT chest. The setting of no underlying connective tissue disease, completely normal pulmonary function testing, and improving symptoms, I think the likelihood of an underlying lung disease is low.  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  If she does not have improvement in cough over the next 2 months then we can perform a bronchoscopy with BAL to assess again  with this infection. However this time considering her clinical improvement I do not think that is necessary.  Plan: -Continue close monitoring with every 6 month PFTs -Plan to repeat high-resolution CT scan in one year to ensure no evidence of interstitial lung disease -If no improvement in cough in 2 months and then perform bronchoscopy with BAL to assess for mycobacterial infection   Cough Her cough has improved. It sounds as if at this point she only has laryngeal irritation contributing to the cough. Her lung exam today was completely normal which is a marked improvement. I believe that  this started with an atypical infection which led to persistent cough and laryngeal irritation. I have encouraged her to use allergy medications this time of year to prevent sinus symptoms from worsening or cough.   Plan: Because the cough has improved we decided to continue with Neurontin as prescribed at this point and I have encouraged her to wean herself off of the Tussionex because I believe it is decreasing her quality of sleep. However, if the cough has not resolved within the next 2 months then we can perform a bronchoscopy and consider enrolling her in a clinical trial.  Greater than 25 minutes were spent today in direct consultation with the patient and her mother answering questions regarding her cough and the findings from her CT scan.     Updated Medication List Outpatient Encounter Prescriptions as of 11/03/2014  Medication Sig  . Acetylcarnitine HCl (ACETYL L-CARNITINE) 500 MG CAPS Take 1 capsule by mouth daily.  Marland Kitchen albuterol (PROVENTIL HFA;VENTOLIN HFA) 108 (90 BASE) MCG/ACT inhaler Inhale 2 puffs into the lungs every 6 (six) hours as needed for wheezing or shortness of breath.  . Alpha-Lipoic Acid 200 MG CAPS Take 1 capsule by mouth daily.  Marland Kitchen aspirin 81 MG tablet Take 81 mg by mouth daily.  . Calcium Carbonate-Vitamin D (CALCIUM-VITAMIN D) 500-200 MG-UNIT per tablet Take 1 tablet by mouth 2 (two) times daily.  . cetirizine (ZYRTEC) 10 MG tablet Take 10 mg by mouth daily.  . chlorpheniramine (CHLOR-TRIMETON) 4 MG tablet Take 4 mg by mouth 2 (two) times daily as needed for allergies.  . chlorpheniramine-HYDROcodone (TUSSIONEX PENNKINETIC ER) 10-8 MG/5ML LQCR Take 5 mLs by mouth every 12 (twelve) hours as needed for cough.  . Cholecalciferol (VITAMIN D3) 2000 UNITS TABS Take 1 tablet by mouth daily.  . Coenzyme Q10 (CO Q 10) 100 MG CAPS Take 1 capsule by mouth daily.  . Flaxseed, Linseed, (FLAXSEED OIL) 1000 MG CAPS Take 1 capsule by mouth daily.  Marland Kitchen gabapentin (NEURONTIN) 300 MG  capsule Take 1 capsule (300 mg total) by mouth 3 (three) times daily.  . Garlic 1000 MG CAPS Take 1 capsule by mouth daily.  . Grape Seed Extract 100 MG CAPS Take 1 capsule by mouth daily.  Marland Kitchen L-Lysine 500 MG TABS Take 1 tablet by mouth daily.  Marland Kitchen levothyroxine (SYNTHROID, LEVOTHROID) 25 MCG tablet Take 25 mcg by mouth daily before breakfast.  . Magnesium 400 MG CAPS Take 1 capsule by mouth daily.  . mometasone (NASONEX) 50 MCG/ACT nasal spray Place 2 sprays into the nose daily.  . Multiple Vitamin (MULTIVITAMIN WITH MINERALS) TABS tablet Take 1 tablet by mouth daily.  . Omega-3 Fatty Acids (FISH OIL) 1200 MG CAPS Take 1 capsule by mouth daily.  Marland Kitchen Respiratory Therapy Supplies (FLUTTER) DEVI Use daily as directed  . rosuvastatin (CRESTOR) 10 MG tablet Take 10 mg by mouth daily.  . traMADol (ULTRAM) 50 MG tablet  Take 50 mg by mouth every 6 (six) hours as needed.  . verapamil (VERELAN PM) 240 MG 24 hr capsule Take 240 mg by mouth 2 (two) times daily.  . vitamin C (ASCORBIC ACID) 500 MG tablet Take 500 mg by mouth daily.  . [DISCONTINUED] gabapentin (NEURONTIN) 100 MG capsule Take  po tid for a week, then  po tid for a week, then  tid until you see me next (Patient not taking: Reported on 11/03/2014)  . [DISCONTINUED] omeprazole (PRILOSEC) 40 MG capsule Take 40 mg by mouth 2 (two) times daily.

## 2014-11-24 ENCOUNTER — Other Ambulatory Visit: Payer: Self-pay

## 2014-11-24 MED ORDER — GABAPENTIN 300 MG PO CAPS: 300.0000 mg | ORAL_CAPSULE | Freq: Three times a day (TID) | ORAL | Status: DC

## 2014-12-14 ENCOUNTER — Encounter: Payer: Self-pay | Admitting: Pulmonary Disease

## 2014-12-14 ENCOUNTER — Other Ambulatory Visit: Payer: Self-pay | Admitting: *Deleted

## 2014-12-14 ENCOUNTER — Ambulatory Visit (INDEPENDENT_AMBULATORY_CARE_PROVIDER_SITE_OTHER): Payer: BLUE CROSS/BLUE SHIELD | Admitting: Pulmonary Disease

## 2014-12-14 VITALS — BP 136/70 | HR 89 | Ht 64.0 in | Wt 160.0 lb

## 2014-12-14 DIAGNOSIS — R05 Cough: Secondary | ICD-10-CM | POA: Diagnosis not present

## 2014-12-14 DIAGNOSIS — R059 Cough, unspecified: Secondary | ICD-10-CM

## 2014-12-14 MED ORDER — GABAPENTIN 300 MG PO CAPS: ORAL_CAPSULE | ORAL | Status: DC

## 2014-12-14 MED ORDER — GABAPENTIN 100 MG PO CAPS: 100.0000 mg | ORAL_CAPSULE | Freq: Every day | ORAL | Status: DC

## 2014-12-14 NOTE — Assessment & Plan Note (Signed)
Jill Crawford has steadily improved but she still has a lingering cough which is occasionally productive of clear mucus. I'm encouraged by the fact that she does not have shortness of breath anymore and she does not have purulent cough. Her lungs sound clear today. So it sounds as if the primary problem at this point is ongoing laryngeal irritation exacerbated from time to time by acid reflux and some postnasal drip.   Plan: Increase gabapentin to 600 mg 3 times a day, she was instructed to do this every 3 weeks time Over-the-counter antiacid therapy GERD diet was reviewed Continue allergic rhinitis regimen as detailed in previous notes

## 2014-12-14 NOTE — Progress Notes (Signed)
Subjective:    Patient ID: Jill SableSusan Denise Crawford, female    DOB: 06/06/1956, 59 y.o.   MRN: [id number]    Synopsis: Jill Crawford was referred to the [facility] [place] pulmonary clinic in 2016 for evaluation of cough and dyspnea.  She had a history of recurrent bronchitis and a very minimal smoking history (less than one week in college).  PFT in 2016 was normal, CXR showed bronchitis.  08/2014 CT chest ([place] read)> patchy areas of peribronchovascular thickening and ggo worrisome for NSIP, doesn't look like UIP but there is a strong cranio-caudal gradient, mild bronchiectasis   HPI Chief Complaint  Patient presents with  . Follow-up    Pt has no breathing complaints.  Does have a prod cough with mostly clear mucus.  States cough is some better but still persistent.    Jill Crawford has been working outside lately and has been better in general.  She continues to cough nearly every day.  Some days are worse than others, but her breathing is much better. About 1 week ago she took tramadol which helped the cough significantly.  She continues to cough up clear phlegm occasionally (10% of the time).  No fever or chills.  No weight loss.  She is taking neurontin 300mg  tid. She thinks that this has really helped.  Her acid reflux is fairly mild but when it flares up it makes the cough worse; she also notes that postnasal drip makes it worse. She continues to take her allergy regimen.  Past Medical History  Diagnosis Date  . Hypertension   . Hypercholesteremia   . Seasonal allergies      Review of Systems  Constitutional: Negative for fever, chills and fatigue.  HENT: Negative for rhinorrhea, sinus pressure and sneezing.   Respiratory: Positive for cough. Negative for shortness of breath and wheezing.   Cardiovascular: Negative for chest pain, palpitations and leg swelling.       Objective:   Physical Exam  Filed Vitals:   12/14/14 1347  BP: 136/70  Pulse: 89  Height: 5\' 4"  (1.626 m)    Weight: 160 lb (72.576 kg)  SpO2: 95%  RA  Gen: well appearing HENT: OP clear, TM's clear, neck supple PULM: CTA B, normal percussion CV: RRR, no mgr, trace edema GI: BS+, soft, nontender Derm: no cyanosis or rash Psyche: normal mood and affect     Assessment & Plan:   Cough Jill Crawford has steadily improved but she still has a lingering cough which is occasionally productive of clear mucus. I'm encouraged by the fact that she does not have shortness of breath anymore and she does not have purulent cough. Her lungs sound clear today. So it sounds as if the primary problem at this point is ongoing laryngeal irritation exacerbated from time to time by acid reflux and some postnasal drip.   Plan: Increase gabapentin to 600 mg 3 times a day, she was instructed to do this every 3 weeks time Over-the-counter antiacid therapy GERD diet was reviewed Continue allergic rhinitis regimen as detailed in previous notes     Updated Medication List Outpatient Encounter Prescriptions as of 12/14/2014  Medication Sig  . Acetylcarnitine HCl (ACETYL L-CARNITINE) 500 MG CAPS Take 1 capsule by mouth daily.  Marland Kitchen. albuterol (PROVENTIL HFA;VENTOLIN HFA) 108 (90 BASE) MCG/ACT inhaler Inhale 2 puffs into the lungs every 6 (six) hours as needed for wheezing or shortness of breath.  . Alpha-Lipoic Acid 200 MG CAPS Take 1 capsule by mouth daily.  Marland Kitchen. aspirin  81 MG tablet Take 81 mg by mouth daily.  . Calcium Carbonate-Vitamin D (CALCIUM-VITAMIN D) 500-200 MG-UNIT per tablet Take 1 tablet by mouth 2 (two) times daily.  . cetirizine (ZYRTEC) 10 MG tablet Take 10 mg by mouth daily.  . chlorpheniramine (CHLOR-TRIMETON) 4 MG tablet Take 4 mg by mouth 2 (two) times daily as needed for allergies.  . chlorpheniramine-HYDROcodone (TUSSIONEX PENNKINETIC ER) 10-8 MG/5ML LQCR Take 5 mLs by mouth every 12 (twelve) hours as needed for cough.  . Cholecalciferol (VITAMIN D3) 2000 UNITS TABS Take 1 tablet by mouth daily.  . Coenzyme  Q10 (CO Q 10) 100 MG CAPS Take 1 capsule by mouth daily.  . Flaxseed, Linseed, (FLAXSEED OIL) 1000 MG CAPS Take 1 capsule by mouth daily.  Marland Kitchen gabapentin (NEURONTIN) 300 MG capsule Take  tid for a week, then  tid for a week, the  tid continuously  . Garlic 1000 MG CAPS Take 1 capsule by mouth daily.  . Grape Seed Extract 100 MG CAPS Take 1 capsule by mouth daily.  Marland Kitchen L-Lysine 500 MG TABS Take 1 tablet by mouth daily.  Marland Kitchen levothyroxine (SYNTHROID, LEVOTHROID) 25 MCG tablet Take 25 mcg by mouth daily before breakfast.  . Magnesium 400 MG CAPS Take 1 capsule by mouth daily.  . mometasone (NASONEX) 50 MCG/ACT nasal spray Place 2 sprays into the nose daily.  . Multiple Vitamin (MULTIVITAMIN WITH MINERALS) TABS tablet Take 1 tablet by mouth daily.  . Omega-3 Fatty Acids (FISH OIL) 1200 MG CAPS Take 1 capsule by mouth daily.  Marland Kitchen Respiratory Therapy Supplies (FLUTTER) DEVI Use daily as directed  . rosuvastatin (CRESTOR) 10 MG tablet Take 10 mg by mouth daily.  . traMADol (ULTRAM) 50 MG tablet Take 50 mg by mouth every 6 (six) hours as needed.  . verapamil (VERELAN PM) 240 MG 24 hr capsule Take 240 mg by mouth 2 (two) times daily.  . vitamin C (ASCORBIC ACID) 500 MG tablet Take 500 mg by mouth daily.  . [DISCONTINUED] gabapentin (NEURONTIN) 300 MG capsule Take 1 capsule (300 mg total) by mouth 3 (three) times daily.   No facility-administered encounter medications on file as of 12/14/2014.

## 2014-12-14 NOTE — Patient Instructions (Signed)
Increase gabapentin to 400 mg 3 times a day for a week, then 500 mg 3 times a day for a week then 600 mg 3 times a day indefinitely Key taking her allergy medications as prescribed Follow the GERD diet we recommended  We will see back in 3 months or sooner if needed

## 2014-12-15 ENCOUNTER — Encounter: Payer: Self-pay | Admitting: Pulmonary Disease

## 2014-12-15 MED ORDER — GABAPENTIN 100 MG PO CAPS: 100.0000 mg | ORAL_CAPSULE | Freq: Every day | ORAL | Status: DC

## 2015-02-22 ENCOUNTER — Encounter: Payer: Self-pay | Admitting: Pulmonary Disease

## 2015-02-22 ENCOUNTER — Telehealth: Payer: Self-pay | Admitting: Pulmonary Disease

## 2015-02-22 ENCOUNTER — Ambulatory Visit (INDEPENDENT_AMBULATORY_CARE_PROVIDER_SITE_OTHER)
Admission: RE | Admit: 2015-02-22 | Discharge: 2015-02-22 | Disposition: A | Discharge status: Home / Self Care | Payer: BLUE CROSS/BLUE SHIELD | Source: Ambulatory Visit | Attending: Pulmonary Disease | Admitting: Pulmonary Disease

## 2015-02-22 ENCOUNTER — Ambulatory Visit (INDEPENDENT_AMBULATORY_CARE_PROVIDER_SITE_OTHER): Payer: BLUE CROSS/BLUE SHIELD | Admitting: Pulmonary Disease

## 2015-02-22 VITALS — BP 144/76 | HR 84 | Ht 64.0 in | Wt 161.0 lb

## 2015-02-22 DIAGNOSIS — R059 Cough, unspecified: Secondary | ICD-10-CM

## 2015-02-22 DIAGNOSIS — R05 Cough: Secondary | ICD-10-CM

## 2015-02-22 DIAGNOSIS — R938 Abnormal findings on diagnostic imaging of other specified body structures: Secondary | ICD-10-CM

## 2015-02-22 DIAGNOSIS — R9389 Abnormal findings on diagnostic imaging of other specified body structures: Secondary | ICD-10-CM

## 2015-02-22 DIAGNOSIS — J849 Interstitial pulmonary disease, unspecified: Secondary | ICD-10-CM

## 2015-02-22 MED ORDER — AZELASTINE HCL 0.1 % NA SOLN: 2.0000 | Freq: Two times a day (BID) | NASAL | Status: DC

## 2015-02-22 NOTE — Patient Instructions (Signed)
Take omeprazole OTC daily Take the astelin nasal spray as prescribed in additon to your other medications. We will schedule a PFT for you in 04/2015 We will see you back in 04/2015

## 2015-02-22 NOTE — Progress Notes (Signed)
Subjective:    Patient ID: Jill Crawford, female    DOB: Nov 15, 1955, 59 y.o.   MRN: [id number]    Synopsis: Jill Crawford was referred to the [facility] pulmonary clinic in 2016 for evaluation of cough and dyspnea.  She had a history of recurrent bronchitis and a very minimal smoking history (less than one week in college).  PFT in 2016 was normal, CXR showed bronchitis.  08/2014 CT chest ([place] read)> patchy areas of peribronchovascular thickening and ggo worrisome for NSIP, doesn't look like UIP but there is a strong cranio-caudal gradient, mild bronchiectasis   HPI Chief Complaint  Patient presents with  . Follow-up    pt states increased gabapentin rx helped with cough until approx 1 month ago.  c/o prod cough with clear-yellow mucus, worse when eating.  Also notes sinus congestion.     Jill Crawford said that about a month ago she started coughing again with mucus production.  She says that Monday she started getting a sore throat, sinus congestion.  No fever or chils.  No shortness of breath, but lots of sinus congestion.  She is staying active, still cutting the grass and stuff.  She wonders if pollen made this worse.  She was exposed to a lot of pollen.  Cough is worse with eating, lying flat, and bending over makes it. She sometimes feels like there is a tightness in her throat.  She is taking: Zyrtec Lloyd Huger med saline rinse Nasonex regularly  Past Medical History  Diagnosis Date  . Hypertension   . Hypercholesteremia   . Seasonal allergies      Review of Systems  Constitutional: Negative for fever, chills and fatigue.  HENT: Negative for rhinorrhea, sinus pressure and sneezing.   Respiratory: Positive for cough. Negative for shortness of breath and wheezing.   Cardiovascular: Negative for chest pain, palpitations and leg swelling.       Objective:   Physical Exam  Filed Vitals:   02/22/15 1016  BP: 144/76  Pulse: 84  Height: 5\' 4"  (1.626 m)  Weight: 161  lb (73.029 kg)  SpO2: 97%  RA  Gen: mildly ill appearing HENT: OP clear, TM's clear, neck supple PULM: Crackles lungs bilaterally, normal percussion CV: RRR, no mgr, trace edema GI: BS+, soft, nontender Derm: no cyanosis or rash Psyche: normal mood and affect       Assessment & Plan:   Cough She is having another flareup of her chronic cough. Because it occurs with eating and when she lies flat think there is a component of acid reflux contributing. However, she also has postnasal drip which is contributing likely due to allergic rhinitis.  Plan: Ramp up allergic rhinitis therapy by adding a nasal anti-histamine in addition to her oral anti-histamines, nasal steroid, and saline rinses regimen Had omeprazole. We checked the interaction list and it turns out that omeprazole does not cause abnormal levels of gabapentin Continue gabapentin 600 mg 3 times a day Check a chest x-ray today to ensure there is no evidence of pneumonia Follow-up in 2 months, if no improvement in cough consider enrollment in clinical trial or adding Elavil.  Abnormal CT scan, chest She had a question of NSIP based on a CT scan this year but she had totally normal lung function testing and no shortness of breath. Therefore I have questioned this diagnosis. However, because of her ongoing cough symptoms and crackles on exam we need to keep a close eye on this.  Plan: Pulmonary function testing  in 2 months Consider repeat CT scanning of the lung based on pulmonary function test results    Updated Medication List Outpatient Encounter Prescriptions as of 02/22/2015  Medication Sig  . Acetylcarnitine HCl (ACETYL L-CARNITINE) 500 MG CAPS Take 1 capsule by mouth daily.  Marland Kitchen albuterol (PROVENTIL HFA;VENTOLIN HFA) 108 (90 BASE) MCG/ACT inhaler Inhale 2 puffs into the lungs every 6 (six) hours as needed for wheezing or shortness of breath.  . Alpha-Lipoic Acid 200 MG CAPS Take 1 capsule by mouth daily.  Marland Kitchen aspirin 81  MG tablet Take 81 mg by mouth daily.  . Calcium Carbonate-Vitamin D (CALCIUM-VITAMIN D) 500-200 MG-UNIT per tablet Take 1 tablet by mouth 2 (two) times daily.  . cetirizine (ZYRTEC) 10 MG tablet Take 10 mg by mouth daily.  . chlorpheniramine (CHLOR-TRIMETON) 4 MG tablet Take 4 mg by mouth 2 (two) times daily as needed for allergies.  . chlorpheniramine-HYDROcodone (TUSSIONEX PENNKINETIC ER) 10-8 MG/5ML LQCR Take 5 mLs by mouth every 12 (twelve) hours as needed for cough.  . Cholecalciferol (VITAMIN D3) 2000 UNITS TABS Take 1 tablet by mouth daily.  . Coenzyme Q10 (CO Q 10) 100 MG CAPS Take 1 capsule by mouth daily.  . Flaxseed, Linseed, (FLAXSEED OIL) 1000 MG CAPS Take 1 capsule by mouth daily.  Marland Kitchen gabapentin (NEURONTIN) 300 MG capsule Take  tid for a week, then  tid for a week, the  tid continuously  . Garlic 1000 MG CAPS Take 1 capsule by mouth daily.  . Grape Seed Extract 100 MG CAPS Take 1 capsule by mouth daily.  Marland Kitchen L-Lysine 500 MG TABS Take 1 tablet by mouth daily.  Marland Kitchen levothyroxine (SYNTHROID, LEVOTHROID) 25 MCG tablet Take 25 mcg by mouth daily before breakfast.  . Magnesium 400 MG CAPS Take 1 capsule by mouth daily.  . mometasone (NASONEX) 50 MCG/ACT nasal spray Place 2 sprays into the nose daily.  . Multiple Vitamin (MULTIVITAMIN WITH MINERALS) TABS tablet Take 1 tablet by mouth daily.  . Omega-3 Fatty Acids (FISH OIL) 1200 MG CAPS Take 1 capsule by mouth daily.  Marland Kitchen Respiratory Therapy Supplies (FLUTTER) DEVI Use daily as directed  . rosuvastatin (CRESTOR) 10 MG tablet Take 10 mg by mouth daily.  . traMADol (ULTRAM) 50 MG tablet Take 50 mg by mouth every 6 (six) hours as needed.  . verapamil (VERELAN PM) 240 MG 24 hr capsule Take 240 mg by mouth 2 (two) times daily.  . vitamin C (ASCORBIC ACID) 500 MG tablet Take 500 mg by mouth daily.  Marland Kitchen azelastine (ASTELIN) 0.1 % nasal spray Place 2 sprays into both nostrils 2 (two) times daily. Use in each nostril as directed  .  [DISCONTINUED] gabapentin (NEURONTIN) 100 MG capsule Take 1 capsule (100 mg total) by mouth daily. (Patient not taking: Reported on 02/22/2015)   No facility-administered encounter medications on file as of 02/22/2015.

## 2015-02-22 NOTE — Assessment & Plan Note (Signed)
She had a question of NSIP based on a CT scan this year but she had totally normal lung function testing and no shortness of breath. Therefore I have questioned this diagnosis. However, because of her ongoing cough symptoms and crackles on exam we need to keep a close eye on this.  Plan: Pulmonary function testing in 2 months Consider repeat CT scanning of the lung based on pulmonary function test results

## 2015-02-22 NOTE — Telephone Encounter (Signed)
Spoke with pt about cxr results, she is requesting tramadol refill.  Pt seen in office today. This is to be called in to [facility] in [place].  BQ are you ok with this refill?  Thanks!

## 2015-02-22 NOTE — Assessment & Plan Note (Addendum)
She is having another flareup of her chronic cough. Because it occurs with eating and when she lies flat think there is a component of acid reflux contributing. However, she also has postnasal drip which is contributing likely due to allergic rhinitis.  Plan: Ramp up allergic rhinitis therapy by adding a nasal anti-histamine in addition to her oral anti-histamines, nasal steroid, and saline rinses regimen Had omeprazole. We checked the interaction list and it turns out that omeprazole does not cause abnormal levels of gabapentin Continue gabapentin 600 mg 3 times a day Check a chest x-ray today to ensure there is no evidence of pneumonia Follow-up in 2 months, if no improvement in cough consider enrollment in clinical trial or adding Elavil.

## 2015-02-24 MED ORDER — TRAMADOL HCL 50 MG PO TABS: 50.0000 mg | ORAL_TABLET | Freq: Four times a day (QID) | ORAL | Status: DC | PRN

## 2015-02-24 NOTE — Telephone Encounter (Signed)
Patient returned call.  She may be reached at [phone].

## 2015-02-24 NOTE — Telephone Encounter (Signed)
Yes but be sure has/ keeps f/u appt

## 2015-02-24 NOTE — Telephone Encounter (Signed)
Spoke with patient- aware that Rx okayed to be refilled.  Aware to keep upcoming appt with BQ 04/27/15 with PFT.  Nothing further needed.

## 2015-02-24 NOTE — Telephone Encounter (Signed)
LM with family member to have patient return call.  

## 2015-02-24 NOTE — Telephone Encounter (Signed)
Sending message to doc of day as it is Friday and pt will be out of med this weekend.   MW are you ok with filling pt's tramadol in BQ's absence? Last filled 09/12/14 #40  Thanks!

## 2015-03-24 ENCOUNTER — Encounter: Payer: Self-pay | Admitting: Pulmonary Disease

## 2015-03-24 NOTE — Telephone Encounter (Signed)
Dr Kendrick Fries, is this pt a candidate for the pneumovax?  Last documented pneumovax was in 2005 Please advise thanks!

## 2015-04-27 ENCOUNTER — Ambulatory Visit (INDEPENDENT_AMBULATORY_CARE_PROVIDER_SITE_OTHER)
Admission: RE | Admit: 2015-04-27 | Discharge: 2015-04-27 | Disposition: A | Discharge status: Home / Self Care | Payer: BLUE CROSS/BLUE SHIELD | Source: Ambulatory Visit | Attending: Pulmonary Disease | Admitting: Pulmonary Disease

## 2015-04-27 ENCOUNTER — Ambulatory Visit (INDEPENDENT_AMBULATORY_CARE_PROVIDER_SITE_OTHER): Payer: BLUE CROSS/BLUE SHIELD | Admitting: Pulmonary Disease

## 2015-04-27 ENCOUNTER — Encounter: Payer: Self-pay | Admitting: Pulmonary Disease

## 2015-04-27 VITALS — BP 122/64 | HR 83 | Ht 66.0 in | Wt 167.6 lb

## 2015-04-27 DIAGNOSIS — R059 Cough, unspecified: Secondary | ICD-10-CM

## 2015-04-27 DIAGNOSIS — R05 Cough: Secondary | ICD-10-CM

## 2015-04-27 DIAGNOSIS — R938 Abnormal findings on diagnostic imaging of other specified body structures: Secondary | ICD-10-CM | POA: Diagnosis not present

## 2015-04-27 DIAGNOSIS — J383 Other diseases of vocal cords: Secondary | ICD-10-CM | POA: Diagnosis not present

## 2015-04-27 DIAGNOSIS — R9389 Abnormal findings on diagnostic imaging of other specified body structures: Secondary | ICD-10-CM

## 2015-04-27 DIAGNOSIS — J849 Interstitial pulmonary disease, unspecified: Secondary | ICD-10-CM

## 2015-04-27 LAB — PULMONARY FUNCTION TEST
DL/VA % PRED: 127 %
DL/VA: 6.14 ml/min/mmHg/L
DLCO unc % pred: 98 %
DLCO unc: 23.92 ml/min/mmHg
FEF 25-75 POST: 4.08 L/s
FEF 25-75 Pre: 3.62 L/sec
FEF2575-%Change-Post: 12 %
FEF2575-%Pred-Post: 167 %
FEF2575-%Pred-Pre: 148 %
FEV1-%Change-Post: 3 %
FEV1-%PRED-PRE: 90 %
FEV1-%Pred-Post: 93 %
FEV1-Post: 2.43 L
FEV1-Pre: 2.36 L
FEV1FVC-%CHANGE-POST: 2 %
FEV1FVC-%Pred-Pre: 112 %
FEV6-%Change-Post: 0 %
FEV6-%Pred-Post: 82 %
FEV6-%Pred-Pre: 82 %
FEV6-PRE: 2.69 L
FEV6-Post: 2.7 L
FEV6FVC-%Pred-Post: 103 %
FEV6FVC-%Pred-Pre: 103 %
FVC-%Change-Post: 0 %
FVC-%PRED-PRE: 80 %
FVC-%Pred-Post: 80 %
FVC-POST: 2.7 L
FVC-PRE: 2.69 L
POST FEV1/FVC RATIO: 90 %
POST FEV6/FVC RATIO: 100 %
PRE FEV1/FVC RATIO: 88 %
Pre FEV6/FVC Ratio: 100 %
RV % pred: 87 %
RV: 1.71 L
TLC % PRED: 88 %
TLC: 4.45 L

## 2015-04-27 MED ORDER — OMEPRAZOLE 40 MG PO CPDR: 40.0000 mg | DELAYED_RELEASE_CAPSULE | Freq: Every day | ORAL | Status: DC

## 2015-04-27 NOTE — Assessment & Plan Note (Signed)
She continues to struggle from cough which is primarily due to irritable larynx syndrome. I think acid reflux makes the symptoms worse as she states that she has worsening cough when she eats and when she bends over. She also has a degree of vocal cord dysfunction.  Plan: Maximize antacid therapy with Prilosec 40 mg daily and Pepcid in the evening Continue gabapentin 600 mg 3 times a day Continue treatment for postnasal drip If no improvement with cough in the next 4 weeks then referred to GI medicine

## 2015-04-27 NOTE — Assessment & Plan Note (Signed)
She had some nonspecific changes on her CT chest earlier this year which was not clearly associated with an inflammatory lung disease. I think that it likely represented an improving infection versus some degree of chronic microaspiration. She continues to have mild crackles in the bases of her lungs but her repeat lung function testing today were completely normal which is reassuring that she does not have any sort of progressive inflammatory lung disease.  Plan: Repeat chest x-ray For now we'll plan on repeating lung function testing on a six-month basis for surveillance

## 2015-04-27 NOTE — Patient Instructions (Signed)
Keep taking the gabapentin as you're doing Take omeprazole 40 mg daily in the morning Take 1 Pepcid AC  in the evening We will call you with the results of today's chest x-ray If you have not had improvement in the cough in about 4 weeks with this and acid regimen then we recommend that you see Dr. Mechele CollinElliott again, call us if you need help with the referral Practice the voice relaxation techniques that I demonstrated today, if you still have problems with vocal cord spasms then we can [place] We will see you back in 2-3 months or sooner if needed

## 2015-04-27 NOTE — Progress Notes (Signed)
PFT done today. 

## 2015-04-27 NOTE — Progress Notes (Signed)
Subjective:    Patient ID: Jill Crawford, female    DOB: Sep 17, 1955, 59 y.o.   MRN: [id number]    Synopsis: Yaretsi Humphres was referred to the [facility] pulmonary clinic in 2016 for evaluation of cough and dyspnea.  She had a history of recurrent bronchitis and a very minimal smoking history (less than one week in college).  PFT in 2016 was normal, CXR showed bronchitis.  08/2014 CT chest ([place] read)> patchy areas of peribronchovascular thickening and ggo worrisome for NSIP, doesn't look like UIP but there is a strong cranio-caudal gradient, mild bronchiectasis October 2016 pulmonary function testing ratio 90%, FEV1 2.43 L (93% predicted) this he is, total lung capacity 4.45 L (88% predicted), DLCO 23.92 (98% predicted).   HPI Chief Complaint  Patient presents with  . Follow-up    pt. had PFT today. pt. states no wheezing. no SOB. no chest pain/tightness. prod. cough clear in color.     Cough still occurs when eating or when she bends over.  Has a tickle in her throat.  No trouble breathing, remaining active in the yard a lot.  Cough is better than when I first met her, but she is really dependent on the gabapentin.  She needs to take it regularly or else. She has a gastroenterologist but hasn't seen him in a while (Dr. Tiffany Kocher at [facility]). She says that she sometimes feels like she gets a "spasm" in her throat which makes the cough worse. Taking tramadol only when she has these bad spasm in her throat.    Past Medical History  Diagnosis Date  . Hypertension   . Hypercholesteremia   . Seasonal allergies      Review of Systems  Constitutional: Negative for fever, chills and fatigue.  HENT: Negative for rhinorrhea, sinus pressure and sneezing.   Respiratory: Positive for cough. Negative for shortness of breath and wheezing.   Cardiovascular: Negative for chest pain, palpitations and leg swelling.       Objective:   Physical Exam  Filed Vitals:   04/27/15 1348  BP: 122/64  Pulse: 83  Height: '5\' 6"'  (1.676 m)  Weight: 167 lb 9.6 oz (76.023 kg)  SpO2: 99%  RA  Gen: mildly ill appearing HENT: OP clear, TM's clear, neck supple PULM: Crackles lungs bilaterally, normal percussion CV: RRR, no mgr, trace edema GI: BS+, soft, nontender Derm: no cyanosis or rash Psyche: normal mood and affect  PFT today reviewed, see summary above     Assessment & Plan:   Abnormal CT scan, chest She had some nonspecific changes on her CT chest earlier this year which was not clearly associated with an inflammatory lung disease. I think that it likely represented an improving infection versus some degree of chronic microaspiration. She continues to have mild crackles in the bases of her lungs but her repeat lung function testing today were completely normal which is reassuring that she does not have any sort of progressive inflammatory lung disease.  Plan: Repeat chest x-ray For now we'll plan on repeating lung function testing on a six-month basis for surveillance  Cough She continues to struggle from cough which is primarily due to irritable larynx syndrome. I think acid reflux makes the symptoms worse as she states that she has worsening cough when she eats and when she bends over. She also has a degree of vocal cord dysfunction.  Plan: Maximize antacid therapy with Prilosec 40 mg daily and Pepcid in the evening Continue gabapentin 600 mg  3 times a day Continue treatment for postnasal drip If no improvement with cough in the next 4 weeks then referred to GI medicine  Vocal cord dysfunction Her lung function testing earlier this year was suggestive of variable upper airway flow obstruction and she has symptoms of intermittent tightness in her throat which sounds consistent with vocal cord dysfunction. Imaging has not shown any sort of tracheal or upper airway abnormality. I think acid reflux may contribute to this to a degree.  Plan: Relaxation  techniques were reviewed at length today in clinic Maximize antacid therapy If no improvement then consider referral to Dr. Wanita Chamberlain in [place].    Updated Medication List Outpatient Encounter Prescriptions as of 04/27/2015  Medication Sig  . Acetylcarnitine HCl (ACETYL L-CARNITINE) 500 MG CAPS Take 1 capsule by mouth daily.  Marland Kitchen albuterol (PROVENTIL HFA;VENTOLIN HFA) 108 (90 BASE) MCG/ACT inhaler Inhale 2 puffs into the lungs every 6 (six) hours as needed for wheezing or shortness of breath.  . Alpha-Lipoic Acid 200 MG CAPS Take 1 capsule by mouth daily.  Marland Kitchen aspirin 81 MG tablet Take 81 mg by mouth daily.  Marland Kitchen azelastine (ASTELIN) 0.1 % nasal spray Place 2 sprays into both nostrils 2 (two) times daily. Use in each nostril as directed  . Calcium Carbonate-Vitamin D (CALCIUM-VITAMIN D) 500-200 MG-UNIT per tablet Take 1 tablet by mouth 2 (two) times daily.  . cetirizine (ZYRTEC) 10 MG tablet Take 10 mg by mouth daily.  . chlorpheniramine (CHLOR-TRIMETON) 4 MG tablet Take 4 mg by mouth 2 (two) times daily as needed for allergies.  . Cholecalciferol (VITAMIN D3) 2000 UNITS TABS Take 1 tablet by mouth daily.  . Coenzyme Q10 (CO Q 10) 100 MG CAPS Take 1 capsule by mouth daily.  . Flaxseed, Linseed, (FLAXSEED OIL) 1000 MG CAPS Take 1 capsule by mouth daily.  Marland Kitchen gabapentin (NEURONTIN) 300 MG capsule Take 418m tid for a week, then 5010mtid for a week, the 60062mid continuously  . Garlic 1001950 CAPS Take 1 capsule by mouth daily.  . Grape Seed Extract 100 MG CAPS Take 1 capsule by mouth daily.  . LMarland KitchenLysine 500 MG TABS Take 1 tablet by mouth daily.  . lMarland Kitchenvothyroxine (SYNTHROID, LEVOTHROID) 25 MCG tablet Take 25 mcg by mouth daily before breakfast.  . mometasone (NASONEX) 50 MCG/ACT nasal spray Place 2 sprays into the nose daily.  . Multiple Vitamin (MULTIVITAMIN WITH MINERALS) TABS tablet Take 1 tablet by mouth daily.  . Omega-3 Fatty Acids (FISH OIL) 1200 MG CAPS Take 1 capsule by  mouth daily.  . RMarland Kitchenspiratory Therapy Supplies (FLUTTER) DEVI Use daily as directed  . rosuvastatin (CRESTOR) 10 MG tablet Take 10 mg by mouth daily.  . traMADol (ULTRAM) 50 MG tablet Take 1 tablet (50 mg total) by mouth every 6 (six) hours as needed.  . verapamil (VERELAN PM) 240 MG 24 hr capsule Take 240 mg by mouth 2 (two) times daily.  . vitamin C (ASCORBIC ACID) 500 MG tablet Take 500 mg by mouth daily.  . oMarland Kitcheneprazole (PRILOSEC) 40 MG capsule Take 1 capsule (40 mg total) by mouth daily.  . [DISCONTINUED] chlorpheniramine-HYDROcodone (TUSSIONEX PENNKINETIC ER) 10-8 MG/5ML LQCR Take 5 mLs by mouth every 12 (twelve) hours as needed for cough. (Patient not taking: Reported on 04/27/2015)  . [DISCONTINUED] Magnesium 400 MG CAPS Take 1 capsule by mouth daily.   No facility-administered encounter medications on file as of 04/27/2015.

## 2015-04-27 NOTE — Assessment & Plan Note (Signed)
Her lung function testing earlier this year was suggestive of variable upper airway flow obstruction and she has symptoms of intermittent tightness in her throat which sounds consistent with vocal cord dysfunction. Imaging has not shown any sort of tracheal or upper airway abnormality. I think acid reflux may contribute to this to a degree.  Plan: Relaxation techniques were reviewed at length today in clinic Maximize antacid therapy If no improvement then consider referral to Dr. Loralie ChampagneSteven Carter [place].

## 2015-04-28 ENCOUNTER — Telehealth: Payer: Self-pay | Admitting: Pulmonary Disease

## 2015-04-28 DIAGNOSIS — I517 Cardiomegaly: Secondary | ICD-10-CM

## 2015-04-28 NOTE — Telephone Encounter (Signed)
Pt notified of CXR results. Echo ordered for [facility] (per pt's request) Nothing further needed.

## 2015-05-01 ENCOUNTER — Ambulatory Visit
Admission: RE | Admit: 2015-05-01 | Discharge: 2015-05-01 | Disposition: A | Discharge status: Home / Self Care | Payer: BLUE CROSS/BLUE SHIELD | Source: Ambulatory Visit | Attending: Pulmonary Disease | Admitting: Pulmonary Disease

## 2015-05-01 ENCOUNTER — Encounter: Payer: Self-pay | Admitting: Pulmonary Disease

## 2015-05-01 DIAGNOSIS — I517 Cardiomegaly: Secondary | ICD-10-CM | POA: Diagnosis not present

## 2015-05-01 NOTE — Progress Notes (Signed)
*  PRELIMINARY RESULTS* Echocardiogram 2D Echocardiogram has been performed.  Jill Crawford 05/01/2015, 11:19 AM

## 2015-05-02 NOTE — Progress Notes (Signed)
Quick Note:  Called, spoke with pt's mother. Pt currently at work. She will ask pt to call office back. ______

## 2015-05-02 NOTE — Telephone Encounter (Signed)
Pt returning call and can be reached @ [phone].[phone] GriffinsStanley A Dalton

## 2015-05-02 NOTE — Telephone Encounter (Signed)
Per echo: Result Note     C,    Please let the patient know this was OK    Thanks,    B   --  I spoke with patient about results and she verbalized understanding and had no questions

## 2015-05-02 NOTE — Progress Notes (Signed)
Quick Note:  Pt aware of cxr results and recs per 04/28/15 phone msg. ______

## 2015-05-04 ENCOUNTER — Other Ambulatory Visit: Payer: Self-pay | Admitting: Family Medicine

## 2015-05-04 DIAGNOSIS — Z1231 Encounter for screening mammogram for malignant neoplasm of breast: Secondary | ICD-10-CM

## 2015-05-23 ENCOUNTER — Ambulatory Visit
Admission: RE | Admit: 2015-05-23 | Discharge: 2015-05-23 | Disposition: A | Discharge status: Home / Self Care | Payer: BLUE CROSS/BLUE SHIELD | Source: Ambulatory Visit | Attending: Family Medicine | Admitting: Family Medicine

## 2015-05-23 DIAGNOSIS — Z1231 Encounter for screening mammogram for malignant neoplasm of breast: Secondary | ICD-10-CM | POA: Insufficient documentation

## 2015-06-20 ENCOUNTER — Other Ambulatory Visit: Payer: Self-pay | Admitting: Pulmonary Disease

## 2015-07-14 ENCOUNTER — Ambulatory Visit (INDEPENDENT_AMBULATORY_CARE_PROVIDER_SITE_OTHER)
Admission: RE | Admit: 2015-07-14 | Discharge: 2015-07-14 | Disposition: A | Discharge status: Home / Self Care | Payer: BLUE CROSS/BLUE SHIELD | Source: Ambulatory Visit | Attending: Pulmonary Disease | Admitting: Pulmonary Disease

## 2015-07-14 ENCOUNTER — Ambulatory Visit (INDEPENDENT_AMBULATORY_CARE_PROVIDER_SITE_OTHER): Payer: BLUE CROSS/BLUE SHIELD | Admitting: Pulmonary Disease

## 2015-07-14 ENCOUNTER — Encounter: Payer: Self-pay | Admitting: Pulmonary Disease

## 2015-07-14 VITALS — BP 126/74 | HR 87 | Ht 66.0 in | Wt 170.0 lb

## 2015-07-14 DIAGNOSIS — R938 Abnormal findings on diagnostic imaging of other specified body structures: Secondary | ICD-10-CM

## 2015-07-14 DIAGNOSIS — R05 Cough: Secondary | ICD-10-CM

## 2015-07-14 DIAGNOSIS — J209 Acute bronchitis, unspecified: Secondary | ICD-10-CM | POA: Diagnosis not present

## 2015-07-14 DIAGNOSIS — R059 Cough, unspecified: Secondary | ICD-10-CM

## 2015-07-14 DIAGNOSIS — R9389 Abnormal findings on diagnostic imaging of other specified body structures: Secondary | ICD-10-CM

## 2015-07-14 MED ORDER — TRAMADOL HCL 50 MG PO TABS: 50.0000 mg | ORAL_TABLET | Freq: Four times a day (QID) | ORAL | Status: DC | PRN

## 2015-07-14 MED ORDER — DOXYCYCLINE HYCLATE 100 MG PO TABS: 100.0000 mg | ORAL_TABLET | Freq: Two times a day (BID) | ORAL | Status: DC

## 2015-07-14 NOTE — Assessment & Plan Note (Signed)
She continues to have crackles on physical exam and the CT scan of her chest from February of this year showed nonspecific findings which could be worrisome for nonspecific interstitial pneumonitis. Fortunately, she has completely normal lung function testing so I doubt she has interstitial lung disease but still not certain why she has this abnormality on her CT chest.  I believe that her cough is mostly due to upper airway irritation but I want to keep a close eye on this pulmonary parenchymal abnormality.  Plan: Repeat high-resolution CT chest in April with a lung function test and follow-up with me afterwards

## 2015-07-14 NOTE — Progress Notes (Signed)
Subjective:    Patient ID: Jill Crawford, female    DOB: [birth year], 59 y.o.   MRN: [id number]    Synopsis: Naseem Adler was referred to the [facility] pulmonary clinic in 2016 for evaluation of cough and dyspnea.  She had a history of recurrent bronchitis and a very minimal smoking history (less than one week in college).  PFT in 2016 was normal, CXR showed bronchitis.  08/2014 CT chest ([place] read)> patchy areas of peribronchovascular thickening and ggo worrisome for NSIP, doesn't look like UIP but there is a strong cranio-caudal gradient, mild bronchiectasis October 2016 pulmonary function testing ratio 90%, FEV1 2.43 L (93% predicted) this he is, total lung capacity 4.45 L (88% predicted), DLCO 23.92 (98% predicted).   HPI Chief Complaint  Patient presents with  . Follow-up    pt c/o headache, sore throat, increased prod cough with brown/gold mucus.      Jaleyah has been around sick folks lately and she has had a head cold for the last 1.5 weeks.  She has had hoarseness, some mild dyspnea. + sore throat.  No fever, no chills.  She has had some neck and shoulder pain.  She has had sinus pain and pressure.  Some mild chest pain and pressure. The cough was actually getting better, but this cold has made it worse She thinks it is now slowly starting to improve in the last 2-3 days however.  Eating irritates the cough.    Prior to this cold, she has been going Ok.  Her chronic cough had not changed much.  She had some mouth ulcers, but no major changes.  No new dyspnea.    She continues to take the gabapentin.    She has gained weight which has come back.   She has been sleeping on a pillow.  Past Medical History  Diagnosis Date  . Hypertension   . Hypercholesteremia   . Seasonal allergies      Review of Systems  Constitutional: Positive for fatigue. Negative for fever and chills.  HENT: Negative for rhinorrhea, sinus pressure and sneezing.   Respiratory:  Positive for cough. Negative for shortness of breath and wheezing.   Cardiovascular: Negative for chest pain, palpitations and leg swelling.       Objective:   Physical Exam  Filed Vitals:   07/14/15 1630  BP: 126/74  Pulse: 87  Height:  (1.676 m)  Weight: 170 lb (77.111 kg)  SpO2: 96%  RA  Gen: well appearing HENT: OP clear, TM's clear, neck supple PULM: Crackles lungs bilaterally, normal percussion CV: RRR, no mgr, trace edema GI: BS+, soft, nontender Derm: no cyanosis or rash Psyche: normal mood and affect  Her most recent primary care physician office visit was reviewed today where she was seen for hypertension. Medications were reviewed and compared against our records. No major deviations.      Assessment & Plan:   Acute bronchitis She is currently suffering from a bout of acute bronchitis which has been lasting for about the last 2 weeks. This is caused and abrupt flareup in her chronic cough. Before this, her cough had actually improved somewhat.  Plan: Chest x-ray to ensure there is no evidence of pneumonia Doxycycline 1 week  Cough Her chronic cough had improved a little bit before this most recent episode of a viral bronchitis. I have advised her to continue taking the gabapentin and to rest her voice is much as possible. I refilled tramadol and instructed her  to rest her voice through the weekend.  Abnormal CT scan, chest She continues to have crackles on physical exam and the CT scan of her chest from February of this year showed nonspecific findings which could be worrisome for nonspecific interstitial pneumonitis. Fortunately, she has completely normal lung function testing so I doubt she has interstitial lung disease but still not certain why she has this abnormality on her CT chest.  I believe that her cough is mostly due to upper airway irritation but I want to keep a close eye on this pulmonary parenchymal abnormality.  Plan: Repeat  high-resolution CT chest in April with a lung function test and follow-up with me afterwards    Updated Medication List Outpatient Encounter Prescriptions as of 07/14/2015  Medication Sig  . Acetylcarnitine HCl (ACETYL L-CARNITINE) 500 MG CAPS Take 1 capsule by mouth daily.  Marland Kitchen albuterol (PROVENTIL HFA;VENTOLIN HFA) 108 (90 BASE) MCG/ACT inhaler Inhale 2 puffs into the lungs every 6 (six) hours as needed for wheezing or shortness of breath.  . Alpha-Lipoic Acid 200 MG CAPS Take 1 capsule by mouth daily.  Marland Kitchen aspirin 81 MG tablet Take 81 mg by mouth daily.  Marland Kitchen azelastine (ASTELIN) 0.1 % nasal spray Place 2 sprays into both nostrils 2 (two) times daily. Use in each nostril as directed  . Calcium Carbonate-Vitamin D (CALCIUM-VITAMIN D) 500-200 MG-UNIT per tablet Take 1 tablet by mouth 2 (two) times daily.  . cetirizine (ZYRTEC) 10 MG tablet Take 10 mg by mouth daily.  . chlorpheniramine (CHLOR-TRIMETON) 4 MG tablet Take 4 mg by mouth 2 (two) times daily as needed for allergies.  . Cholecalciferol (VITAMIN D3) 2000 UNITS TABS Take 1 tablet by mouth daily.  . Coenzyme Q10 (CO Q 10) 100 MG CAPS Take 1 capsule by mouth daily.  . famotidine (PEPCID) 20 MG tablet Take 20 mg by mouth at bedtime.  . Flaxseed, Linseed, (FLAXSEED OIL) 1000 MG CAPS Take 1 capsule by mouth daily.  Marland Kitchen gabapentin (NEURONTIN) 300 MG capsule Take 2 capsules (600 mg total) by mouth 3 (three) times daily.  . Garlic 1000 MG CAPS Take 1 capsule by mouth daily.  . Grape Seed Extract 100 MG CAPS Take 1 capsule by mouth daily.  Marland Kitchen L-Lysine 500 MG TABS Take 1 tablet by mouth daily.  Marland Kitchen levothyroxine (SYNTHROID, LEVOTHROID) 25 MCG tablet Take 25 mcg by mouth daily before breakfast.  . mometasone (NASONEX) 50 MCG/ACT nasal spray Place 2 sprays into the nose daily.  . Multiple Vitamin (MULTIVITAMIN WITH MINERALS) TABS tablet Take 1 tablet by mouth daily.  . Omega-3 Fatty Acids (FISH OIL) 1200 MG CAPS Take 1 capsule by mouth daily.  Marland Kitchen  omeprazole (PRILOSEC) 40 MG capsule Take 1 capsule (40 mg total) by mouth daily.  Marland Kitchen Respiratory Therapy Supplies (FLUTTER) DEVI Use daily as directed  . rosuvastatin (CRESTOR) 10 MG tablet Take 10 mg by mouth daily.  . traMADol (ULTRAM) 50 MG tablet Take 1 tablet (50 mg total) by mouth every 6 (six) hours as needed.  . verapamil (VERELAN PM) 240 MG 24 hr capsule Take 240 mg by mouth 2 (two) times daily.  . vitamin C (ASCORBIC ACID) 500 MG tablet Take 500 mg by mouth daily.  . [DISCONTINUED] traMADol (ULTRAM) 50 MG tablet Take 1 tablet (50 mg total) by mouth every 6 (six) hours as needed.  . doxycycline (VIBRA-TABS) 100 MG tablet Take 1 tablet (100 mg total) by mouth 2 (two) times daily.   No facility-administered encounter medications on  file as of 07/14/2015.

## 2015-07-14 NOTE — Assessment & Plan Note (Signed)
Her chronic cough had improved a little bit before this most recent episode of a viral bronchitis. I have advised her to continue taking the gabapentin and to rest her voice is much as possible. I refilled tramadol and instructed her to rest her voice through the weekend.

## 2015-07-14 NOTE — Patient Instructions (Signed)
Take the doxycycline 100 mg by mouth twice a day for a week Use tramadol around-the-clock (every 6 hours) with voice rest as we have described before to rest her voice and improve the cough On your next visit I'm going to have you get a lung function test and a CT scan of your chest I will see you back in April

## 2015-07-14 NOTE — Assessment & Plan Note (Signed)
She is currently suffering from a bout of acute bronchitis which has been lasting for about the last 2 weeks. This is caused and abrupt flareup in her chronic cough. Before this, her cough had actually improved somewhat.  Plan: Chest x-ray to ensure there is no evidence of pneumonia Doxycycline 1 week

## 2015-07-17 ENCOUNTER — Other Ambulatory Visit: Payer: Self-pay | Admitting: Pulmonary Disease

## 2015-09-19 ENCOUNTER — Other Ambulatory Visit: Payer: Self-pay | Admitting: Pulmonary Disease

## 2015-11-07 ENCOUNTER — Ambulatory Visit
Admission: RE | Admit: 2015-11-07 | Discharge: 2015-11-07 | Disposition: A | Discharge status: Home / Self Care | Payer: BLUE CROSS/BLUE SHIELD | Source: Ambulatory Visit | Attending: Pulmonary Disease | Admitting: Pulmonary Disease

## 2015-11-07 DIAGNOSIS — R9389 Abnormal findings on diagnostic imaging of other specified body structures: Secondary | ICD-10-CM

## 2015-11-07 DIAGNOSIS — R938 Abnormal findings on diagnostic imaging of other specified body structures: Secondary | ICD-10-CM | POA: Diagnosis present

## 2015-11-07 DIAGNOSIS — R918 Other nonspecific abnormal finding of lung field: Secondary | ICD-10-CM | POA: Diagnosis not present

## 2015-11-08 ENCOUNTER — Other Ambulatory Visit: Payer: Self-pay | Admitting: Pulmonary Disease

## 2015-11-10 ENCOUNTER — Ambulatory Visit (INDEPENDENT_AMBULATORY_CARE_PROVIDER_SITE_OTHER): Payer: BLUE CROSS/BLUE SHIELD | Admitting: Pulmonary Disease

## 2015-11-10 ENCOUNTER — Encounter: Payer: Self-pay | Admitting: Pulmonary Disease

## 2015-11-10 VITALS — BP 132/74 | HR 75 | Ht 64.0 in | Wt 165.0 lb

## 2015-11-10 DIAGNOSIS — R9389 Abnormal findings on diagnostic imaging of other specified body structures: Secondary | ICD-10-CM

## 2015-11-10 DIAGNOSIS — R938 Abnormal findings on diagnostic imaging of other specified body structures: Secondary | ICD-10-CM

## 2015-11-10 DIAGNOSIS — R05 Cough: Secondary | ICD-10-CM

## 2015-11-10 DIAGNOSIS — J209 Acute bronchitis, unspecified: Secondary | ICD-10-CM

## 2015-11-10 DIAGNOSIS — R059 Cough, unspecified: Secondary | ICD-10-CM

## 2015-11-10 LAB — PULMONARY FUNCTION TEST
DL/VA % pred: 119 %
DL/VA: 5.75 ml/min/mmHg/L
DLCO COR: 22.3 ml/min/mmHg
DLCO UNC % PRED: 89 %
DLCO UNC: 21.66 ml/min/mmHg
DLCO cor % pred: 91 %
FEF 25-75 PRE: 3.21 L/s
FEF 25-75 Post: 3.36 L/sec
FEF2575-%Change-Post: 4 %
FEF2575-%PRED-PRE: 133 %
FEF2575-%Pred-Post: 139 %
FEV1-%Change-Post: 0 %
FEV1-%PRED-PRE: 90 %
FEV1-%Pred-Post: 90 %
FEV1-POST: 2.37 L
FEV1-Pre: 2.36 L
FEV1FVC-%Change-Post: 1 %
FEV1FVC-%PRED-PRE: 110 %
FEV6-%CHANGE-POST: -1 %
FEV6-%PRED-POST: 83 %
FEV6-%PRED-PRE: 84 %
FEV6-POST: 2.69 L
FEV6-Pre: 2.73 L
FEV6FVC-%CHANGE-POST: 0 %
FEV6FVC-%PRED-POST: 104 %
FEV6FVC-%Pred-Pre: 104 %
FVC-%Change-Post: -1 %
FVC-%PRED-POST: 80 %
FVC-%Pred-Pre: 81 %
FVC-POST: 2.69 L
FVC-Pre: 2.73 L
POST FEV6/FVC RATIO: 100 %
PRE FEV1/FVC RATIO: 86 %
Post FEV1/FVC ratio: 88 %
Pre FEV6/FVC Ratio: 100 %
RV % pred: 102 %
RV: 2 L
TLC % PRED: 95 %
TLC: 4.83 L

## 2015-11-10 MED ORDER — MONTELUKAST SODIUM 10 MG PO TABS: 10.0000 mg | ORAL_TABLET | Freq: Every day | ORAL | Status: DC

## 2015-11-10 NOTE — Assessment & Plan Note (Signed)
She has recurrent cough related to vocal cord dysfunction, irritable larynx syndrome and allergic rhinitis.  Her allergic rhinitis is giving her most of the problem right now.  The irritable larynx portion of the cough is manageable on gabapentin as scheduled.  Plan: Continue zyrtec and nasal steroid (OK to change to Flonase) Add montelukast Consider repeat allergy testing Continue voice rest when she has flare ups F/u 12 months or sooner if needed

## 2015-11-10 NOTE — Progress Notes (Signed)
PFT done today. 

## 2015-11-10 NOTE — Patient Instructions (Signed)
Continue taking the gabapentin as you are doing Take Singulair one pill daily Continue taking the allergy medicines as you are doing We will see you back in 12 months or sooner if needed

## 2015-11-10 NOTE — Progress Notes (Signed)
Subjective:    Patient ID: Jill Crawford, female    DOB: February 18, 1956, 60 y.o.   MRN: [id number]    Synopsis: Jill Crawford was referred to the [facility] pulmonary clinic in 2016 for evaluation of cough and dyspnea.  She had a history of recurrent bronchitis and a very minimal smoking history (less than one week in college).  PFT in 2016 was normal, CXR showed bronchitis.  08/2014 CT chest ([place] read)> patchy areas of peribronchovascular thickening and ggo worrisome for NSIP, doesn't look like UIP but there is a strong cranio-caudal gradient, mild bronchiectasis October 2016 pulmonary function testing ratio 90%, FEV1 2.43 L (93% predicted) this he is, total lung capacity 4.45 L (88% predicted), DLCO 23.92 (98% predicted). April 2017 pulmonary function testing ratio 88%, FVC 2.69 L, total lung capacity 4.83 L, DLCO 21.66 (89% predicted) April 2017 high-resolution CT scan. Findings previously worrisome for NSIP have now completely resolved. No evidence of underlying pulmonary parenchymal disease.    HPI Chief Complaint  Patient presents with  . Follow-up    review PFT and HRCT.  pt states she is doing well, notes a prod cough with clear to green mucus.     Jill Crawford is still coughing, but it is better than it was a year ago. The gabapentin has really helped control it.  She coughs a lot when she eats.  She says that any sort of oral intake will make her cough.  She typically will get over it after about two minutes with a sip of water.  She will sometimes feel tightness in her throat that be associated with mild dyspnea for a few weeks. The pollen is really bothering her with her sinus congestion, post nasal drip.  She is taking Zyrtec daily, she continues to take her nasal sprays we have recommended.  She says that nothing has really helped her allergy symptoms over the years.    Past Medical History  Diagnosis Date  . Hypertension   . Hypercholesteremia   . Seasonal allergies        Review of Systems  Constitutional: Positive for fatigue. Negative for fever and chills.  HENT: Negative for rhinorrhea, sinus pressure and sneezing.   Respiratory: Positive for cough. Negative for shortness of breath and wheezing.   Cardiovascular: Negative for chest pain, palpitations and leg swelling.       Objective:   Physical Exam  Filed Vitals:   11/10/15 1014  BP: 132/74  Pulse: 75  Height:  (1.626 m)  Weight: 165 lb (74.844 kg)  SpO2: 98%  RA  Gen: well appearing HENT: OP clear, TM's clear, neck supple PULM: Few coarse lungs bilaterally, normal percussion CV: RRR, no mgr, trace edema GI: BS+, soft, nontender Derm: no cyanosis or rash Psyche: normal mood and affect  Images from yesterday's CT chest personally reviewed showing no evidence of underlying lung disease.      Assessment & Plan:   Abnormal CT scan, chest I am pleased that her repeat CT chest is absolutely normal and her PFTs are equally as normal.  So at this time I have no evidence of underlying lung disease.    Cough She has recurrent cough related to vocal cord dysfunction, irritable larynx syndrome and allergic rhinitis.  Her allergic rhinitis is giving her most of the problem right now.  The irritable larynx portion of the cough is manageable on gabapentin as scheduled.  Plan: Continue zyrtec and nasal steroid (OK to change to Flonase) Add  montelukast Consider repeat allergy testing Continue voice rest when she has flare ups F/u 12 months or sooner if needed    Updated Medication List Outpatient Encounter Prescriptions as of 11/10/2015  Medication Sig  . Acetylcarnitine HCl (ACETYL L-CARNITINE) 500 MG CAPS Take 1 capsule by mouth daily.  Marland Kitchen. albuterol (PROVENTIL HFA;VENTOLIN HFA) 108 (90 BASE) MCG/ACT inhaler Inhale 2 puffs into the lungs every 6 (six) hours as needed for wheezing or shortness of breath.  . Alpha-Lipoic Acid 200 MG CAPS Take 1 capsule by mouth daily.  Marland Kitchen. aspirin  81 MG tablet Take 81 mg by mouth daily.  Marland Kitchen. azelastine (ASTELIN) 0.1 % nasal spray Place 2 sprays into both nostrils 2 (two) times daily. Use in each nostril as directed  . b complex vitamins capsule Take 1 capsule by mouth daily.  . Calcium Carbonate-Vitamin D (CALCIUM-VITAMIN D) 500-200 MG-UNIT per tablet Take 1 tablet by mouth 2 (two) times daily.  . cetirizine (ZYRTEC) 10 MG tablet Take 10 mg by mouth daily.  . chlorpheniramine (CHLOR-TRIMETON) 4 MG tablet Take 4 mg by mouth 2 (two) times daily as needed for allergies.  . Cholecalciferol (VITAMIN D3) 2000 UNITS TABS Take 1 tablet by mouth daily.  . Coenzyme Q10 (CO Q 10) 100 MG CAPS Take 1 capsule by mouth daily.  Marland Kitchen. docusate sodium (COLACE) 100 MG capsule Take 100 mg by mouth 3 (three) times daily.  . Flaxseed, Linseed, (FLAXSEED OIL) 1000 MG CAPS Take 1 capsule by mouth daily.  Marland Kitchen. gabapentin (NEURONTIN) 300 MG capsule TAKE 2 CAPSULES 3 TIMES DAILY  . Garlic 1000 MG CAPS Take 1 capsule by mouth daily.  . Glucos-Chond-Hyal Ac-Ca Fructo (MOVE FREE JOINT HEALTH ADVANCE PO) Take 1 capsule by mouth daily.  . Grape Seed Extract 100 MG CAPS Take 1 capsule by mouth daily.  Marland Kitchen. L-Lysine 500 MG TABS Take 1 tablet by mouth daily.  Marland Kitchen. levothyroxine (SYNTHROID, LEVOTHROID) 25 MCG tablet Take 25 mcg by mouth daily before breakfast.  . mometasone (NASONEX) 50 MCG/ACT nasal spray Place 2 sprays into the nose daily.  . Multiple Vitamin (MULTIVITAMIN WITH MINERALS) TABS tablet Take 1 tablet by mouth daily.  . Omega-3 Fatty Acids (FISH OIL) 1200 MG CAPS Take 1 capsule by mouth daily.  Marland Kitchen. omeprazole (PRILOSEC) 40 MG capsule TAKE 1 CAPSULE EVERY DAY  . Probiotic Product (PROBIOTIC ADVANCED PO) Take 1 tablet by mouth daily.  Marland Kitchen. Respiratory Therapy Supplies (FLUTTER) DEVI Use daily as directed  . rosuvastatin (CRESTOR) 10 MG tablet Take 10 mg by mouth daily.  . traMADol (ULTRAM) 50 MG tablet Take 1 tablet (50 mg total) by mouth every 6 (six) hours as needed.  .  verapamil (VERELAN PM) 240 MG 24 hr capsule Take 240 mg by mouth 2 (two) times daily.  . vitamin C (ASCORBIC ACID) 500 MG tablet Take 500 mg by mouth daily.  . montelukast (SINGULAIR) 10 MG tablet Take 1 tablet (10 mg total) by mouth at bedtime.  . [DISCONTINUED] doxycycline (VIBRA-TABS) 100 MG tablet Take 1 tablet (100 mg total) by mouth 2 (two) times daily. (Patient not taking: Reported on 11/10/2015)  . [DISCONTINUED] famotidine (PEPCID) 20 MG tablet Take 20 mg by mouth at bedtime. Reported on 11/10/2015   No facility-administered encounter medications on file as of 11/10/2015.

## 2015-11-10 NOTE — Assessment & Plan Note (Signed)
I am pleased that her repeat CT chest is absolutely normal and her PFTs are equally as normal.  So at this time I have no evidence of underlying lung disease.

## 2016-01-04 ENCOUNTER — Other Ambulatory Visit: Payer: Self-pay | Admitting: Pulmonary Disease

## 2016-02-02 ENCOUNTER — Other Ambulatory Visit: Payer: Self-pay | Admitting: Pulmonary Disease

## 2016-05-06 ENCOUNTER — Other Ambulatory Visit: Payer: Self-pay | Admitting: Pulmonary Disease

## 2016-05-15 ENCOUNTER — Encounter: Payer: Self-pay | Admitting: *Deleted

## 2016-06-04 ENCOUNTER — Other Ambulatory Visit: Payer: Self-pay | Admitting: Family Medicine

## 2016-06-04 DIAGNOSIS — Z1231 Encounter for screening mammogram for malignant neoplasm of breast: Secondary | ICD-10-CM

## 2016-06-14 ENCOUNTER — Ambulatory Visit: Admission: RE | Admit: 2016-06-14 | Payer: BLUE CROSS/BLUE SHIELD | Source: Ambulatory Visit

## 2016-06-25 ENCOUNTER — Ambulatory Visit
Admission: RE | Admit: 2016-06-25 | Discharge: 2016-06-25 | Disposition: A | Discharge status: Home / Self Care | Payer: BLUE CROSS/BLUE SHIELD | Source: Ambulatory Visit | Attending: Family Medicine | Admitting: Family Medicine

## 2016-06-25 DIAGNOSIS — Z1231 Encounter for screening mammogram for malignant neoplasm of breast: Secondary | ICD-10-CM | POA: Diagnosis present

## 2016-07-01 ENCOUNTER — Other Ambulatory Visit: Payer: Self-pay | Admitting: Pulmonary Disease

## 2016-07-17 ENCOUNTER — Telehealth: Payer: Self-pay | Admitting: Pulmonary Disease

## 2016-07-17 NOTE — Telephone Encounter (Signed)
Attempted to contact pt. No answer, no option to leave a message. Will try back.  

## 2016-07-18 NOTE — Telephone Encounter (Signed)
lmtcb x1 for pt. 

## 2016-07-19 MED ORDER — TRAMADOL HCL 50 MG PO TABS: 50.0000 mg | ORAL_TABLET | Freq: Four times a day (QID) | ORAL | 1 refills | Status: DC | PRN

## 2016-07-19 NOTE — Telephone Encounter (Signed)
BQ  Please Advise- Sick Message  PT. Called in c/o congestion and coughing up yellow phlegm, sore throat, sinus pressure, states when she blows her nose she notices some blood,she has been wheezing Denies increase sob, fever. Pt. Wanted you to know she only has three left of her tramadol.  Would like to know what to do.   Roxine CaddySusan D. Zody  to Marcene DuosPatrice E Rattray       9:34 AM  Patient called complaining of chest congestion and sinus drainage wants to only see BQ. Offered another provider pt refused-pr

## 2016-07-19 NOTE — Telephone Encounter (Signed)
Saline rinses Jill Crawford(Neil Med rinse) bid OTC congestion meds: chlorpheniramine-phenylephrine tabs prn Refill tramadol 50mg  po q6h prn cough dispense #45, refill 1 Call if no improvement

## 2016-07-19 NOTE — Telephone Encounter (Signed)
Pt returning call.Jill Crawford ° °

## 2016-07-19 NOTE — Telephone Encounter (Signed)
Called and spoke to pt. Informed her of the recs per BQ. Rx called into preferred pharmacy. Pt verbalized understanding and denied any further questions or concerns at this time.

## 2016-07-19 NOTE — Telephone Encounter (Signed)
lmtcb X 2 

## 2016-08-17 ENCOUNTER — Ambulatory Visit (INDEPENDENT_AMBULATORY_CARE_PROVIDER_SITE_OTHER): Payer: BLUE CROSS/BLUE SHIELD

## 2016-08-17 ENCOUNTER — Ambulatory Visit
Admission: EM | Admit: 2016-08-17 | Discharge: 2016-08-17 | Disposition: A | Discharge status: Home / Self Care | Payer: BLUE CROSS/BLUE SHIELD | Attending: Family Medicine | Admitting: Family Medicine

## 2016-08-17 DIAGNOSIS — M7672 Peroneal tendinitis, left leg: Secondary | ICD-10-CM | POA: Diagnosis not present

## 2016-08-17 NOTE — ED Provider Notes (Signed)
CSN: [id number]     Arrival date & time 08/17/16  1308 History   First MD Initiated Contact with Patient 08/17/16 1516     Chief Complaint  Patient presents with  . Ankle Pain    left   (Consider location/radiation/quality/duration/timing/severity/associated sxs/prior Treatment) HPI  61 year old female sense with left ankle swelling. This started approximately 3-4 days ago. Notices an increased pain with standing for prolonged periods or while walking. No medial ankle pain. There is no history of any injury or overuse that she can explain. Visit swelling is worse at night. Dorsiflexion and eversion seem to be the most painful.      Past Medical History:  Diagnosis Date  . Hypercholesteremia   . Hypertension   . Seasonal allergies    Past Surgical History:  Procedure Laterality Date  . CARPAL TUNNEL RELEASE    . THYROID SURGERY    . TONSILLECTOMY     Family History  Problem Relation Age of Onset  . Heart disease Father   . Colon cancer Father   . Hypertension Mother   . Breast cancer Neg Hx    Social History  Substance Use Topics  . Smoking status: Never Smoker  . Smokeless tobacco: Never Used     Comment: exposed to secondhand smoke growing up.   . Alcohol use No   OB History    No data available     Review of Systems  Constitutional: Positive for activity change. Negative for chills, fatigue and fever.  Musculoskeletal: Positive for arthralgias, gait problem and joint swelling.  All other systems reviewed and are negative.   Allergies  Levaquin [levofloxacin in d5w]; Penicillins; Sulfa antibiotics; and Aspirin  Home Medications   Prior to Admission medications   Medication Sig Start Date End Date Taking? Authorizing Provider  Acetylcarnitine HCl (ACETYL L-CARNITINE) 500 MG CAPS Take 1 capsule by mouth daily.   Yes Historical Provider, MD  albuterol (PROVENTIL HFA;VENTOLIN HFA) 108 (90 BASE) MCG/ACT inhaler Inhale 2 puffs into the lungs every 6 (six) hours  as needed for wheezing or shortness of breath.   Yes Historical Provider, MD  Alpha-Lipoic Acid 200 MG CAPS Take 1 capsule by mouth daily.   Yes Historical Provider, MD  aspirin 81 MG tablet Take 81 mg by mouth daily.   Yes Historical Provider, MD  azelastine (ASTELIN) 0.1 % nasal spray Place 2 sprays into both nostrils 2 (two) times daily. Use in each nostril as directed 02/22/15  Yes Lupita Leash, MD  b complex vitamins capsule Take 1 capsule by mouth daily.   Yes Historical Provider, MD  Calcium Carbonate-Vitamin D (CALCIUM-VITAMIN D) 500-200 MG-UNIT per tablet Take 1 tablet by mouth 2 (two) times daily.   Yes Historical Provider, MD  cetirizine (ZYRTEC) 10 MG tablet Take 10 mg by mouth daily.   Yes Historical Provider, MD  chlorpheniramine (CHLOR-TRIMETON) 4 MG tablet Take 4 mg by mouth 2 (two) times daily as needed for allergies.   Yes Historical Provider, MD  Cholecalciferol (VITAMIN D3) 2000 UNITS TABS Take 1 tablet by mouth daily.   Yes Historical Provider, MD  Coenzyme Q10 (CO Q 10) 100 MG CAPS Take 1 capsule by mouth daily.   Yes Historical Provider, MD  docusate sodium (COLACE) 100 MG capsule Take 100 mg by mouth 3 (three) times daily.   Yes Historical Provider, MD  Flaxseed, Linseed, (FLAXSEED OIL) 1000 MG CAPS Take 1 capsule by mouth daily.   Yes Historical Provider, MD  gabapentin (NEURONTIN) 300  MG capsule TAKE 2 CAPSULES BY MOUTH 3 TIMES DAILY 07/01/16  Yes Lupita Leash, MD  Garlic 1000 MG CAPS Take 1 capsule by mouth daily.   Yes Historical Provider, MD  Glucos-Chond-Hyal Ac-Ca Fructo (MOVE FREE JOINT HEALTH ADVANCE PO) Take 1 capsule by mouth daily.   Yes Historical Provider, MD  Grape Seed Extract 100 MG CAPS Take 1 capsule by mouth daily.   Yes Historical Provider, MD  L-Lysine 500 MG TABS Take 1 tablet by mouth daily.   Yes Historical Provider, MD  levothyroxine (SYNTHROID, LEVOTHROID) 25 MCG tablet Take 25 mcg by mouth daily before breakfast.   Yes Historical Provider,  MD  mometasone (NASONEX) 50 MCG/ACT nasal spray Place 2 sprays into the nose daily.   Yes Historical Provider, MD  montelukast (SINGULAIR) 10 MG tablet Take 1 tablet (10 mg total) by mouth at bedtime. 11/10/15  Yes Lupita Leash, MD  Multiple Vitamin (MULTIVITAMIN WITH MINERALS) TABS tablet Take 1 tablet by mouth daily.   Yes Historical Provider, MD  Omega-3 Fatty Acids (FISH OIL) 1200 MG CAPS Take 1 capsule by mouth daily.   Yes Historical Provider, MD  omeprazole (PRILOSEC) 40 MG capsule TAKE 1 CAPSULE BY MOUTH DAILY 02/02/16  Yes Lupita Leash, MD  Probiotic Product (PROBIOTIC ADVANCED PO) Take 1 tablet by mouth daily.   Yes Historical Provider, MD  Respiratory Therapy Supplies (FLUTTER) DEVI Use daily as directed 08/23/14  Yes Lupita Leash, MD  rosuvastatin (CRESTOR) 10 MG tablet Take 10 mg by mouth daily.   Yes Historical Provider, MD  traMADol (ULTRAM) 50 MG tablet Take 1 tablet (50 mg total) by mouth every 6 (six) hours as needed (cough). 07/19/16  Yes Lupita Leash, MD  verapamil (VERELAN PM) 240 MG 24 hr capsule Take 240 mg by mouth 2 (two) times daily.   Yes Historical Provider, MD  vitamin C (ASCORBIC ACID) 500 MG tablet Take 500 mg by mouth daily.   Yes Historical Provider, MD   Meds Ordered and Administered this Visit  Medications - No data to display  BP (!) 125/55 (BP Location: Left Arm)   Pulse 93   Temp 98.5 F (36.9 C) (Oral)   Resp 16   Ht 5\' 5"  (1.651 m)   Wt 160 lb (72.6 kg)   SpO2 95%   BMI 26.63 kg/m  No data found.   Physical Exam  Constitutional: She is oriented to person, place, and time. She appears well-developed and well-nourished. No distress.  HENT:  Head: Normocephalic and atraumatic.  Eyes: EOM are normal. Pupils are equal, round, and reactive to light.  Neck: Normal range of motion. Neck supple.  Musculoskeletal:  Examination of the left ankle shows swelling that is localized over the lateral malleolus. Sent flaccid dorsiflexion of  approximately 90. Also is decreased and has pain associated with it. Plantar flexion is by approximately 15 as is lateral ankle pain but is full. She has good subtalar motion. Tenderness is maximal over the posterior lateral malleolus in a area extending approximately 4 cm to at the tip of the malleolus itself.  Neurological: She is alert and oriented to person, place, and time.  Skin: Skin is warm and dry. She is not diaphoretic.  Psychiatric: She has a normal mood and affect. Her behavior is normal. Judgment and thought content normal.  Nursing note and vitals reviewed.   Urgent Care Course     Procedures (including critical care time)  Labs Review Labs Reviewed - No  data to display  Imaging Review Dg Ankle Complete Left  Result Date: 08/17/2016 CLINICAL DATA:  Lateral ankle pain for 4 days with no known trauma. EXAM: LEFT ANKLE COMPLETE - 3+ VIEW COMPARISON:  None. FINDINGS: Sequela of previous medial avulsion injury. Mild lateral soft tissue swelling. No fracture, dislocation, bony erosion, or bony lesion. The ankle mortise is intact. No other acute abnormalities. IMPRESSION: Mild lateral soft tissue swelling.  No other acute abnormality. Electronically Signed   By: Gerome Samavid  Williams III M.D   On: 08/17/2016 16:15     Visual Acuity Review  Right Eye Distance:   Left Eye Distance:   Bilateral Distance:    Right Eye Near:   Left Eye Near:    Bilateral Near:         MDM   1. Peroneus longus tendonitis, left    New Prescriptions   No medications on file  Plan: 1. Test/x-ray results and diagnosis reviewed with patient 2. rx as per orders; risks, benefits, potential side effects reviewed with patient 3. Recommend supportive treatment with Elevation sufficient to control swelling. Use ice 20 minutes out of every 2 hours. Use boot for comfort and improve ambulation. Follow-up with podiatrist if not improving 4. F/u prn if symptoms worsen or don't improve     Lutricia FeilWilliam P  Roemer, PA-C 08/17/16 1639

## 2016-08-17 NOTE — ED Triage Notes (Signed)
Patient complains of left ankle swelling. Patient states that this started 3-4 days ago. Patient has had no known injury to her ankle. Patient reports that swelling is worse at night and bending her ankle back hurts worse.

## 2016-08-17 NOTE — Discharge Instructions (Signed)
Elevate to control swelling. Ice 20 minutes out of every 2 hours. Use ibuprofen for pain. Follow-up with podiatrist if not improving

## 2016-08-30 DIAGNOSIS — M65331 Trigger finger, right middle finger: Secondary | ICD-10-CM | POA: Insufficient documentation

## 2016-08-30 DIAGNOSIS — G5602 Carpal tunnel syndrome, left upper limb: Secondary | ICD-10-CM | POA: Insufficient documentation

## 2016-09-02 ENCOUNTER — Other Ambulatory Visit: Payer: Self-pay | Admitting: Pulmonary Disease

## 2016-09-28 IMAGING — CR DG CHEST 2V
1 series · 2 of 2 positions shown · non-contrast
Comparison: None.

CLINICAL DATA: Cough for 5 months with yellow chunky sputum
intermittently, history hypertension

EXAM:
CHEST  2 VIEW

[Series 1: w chest pa · 0.14mm/px · 2 of 2 slices shown]
[im 1/2]
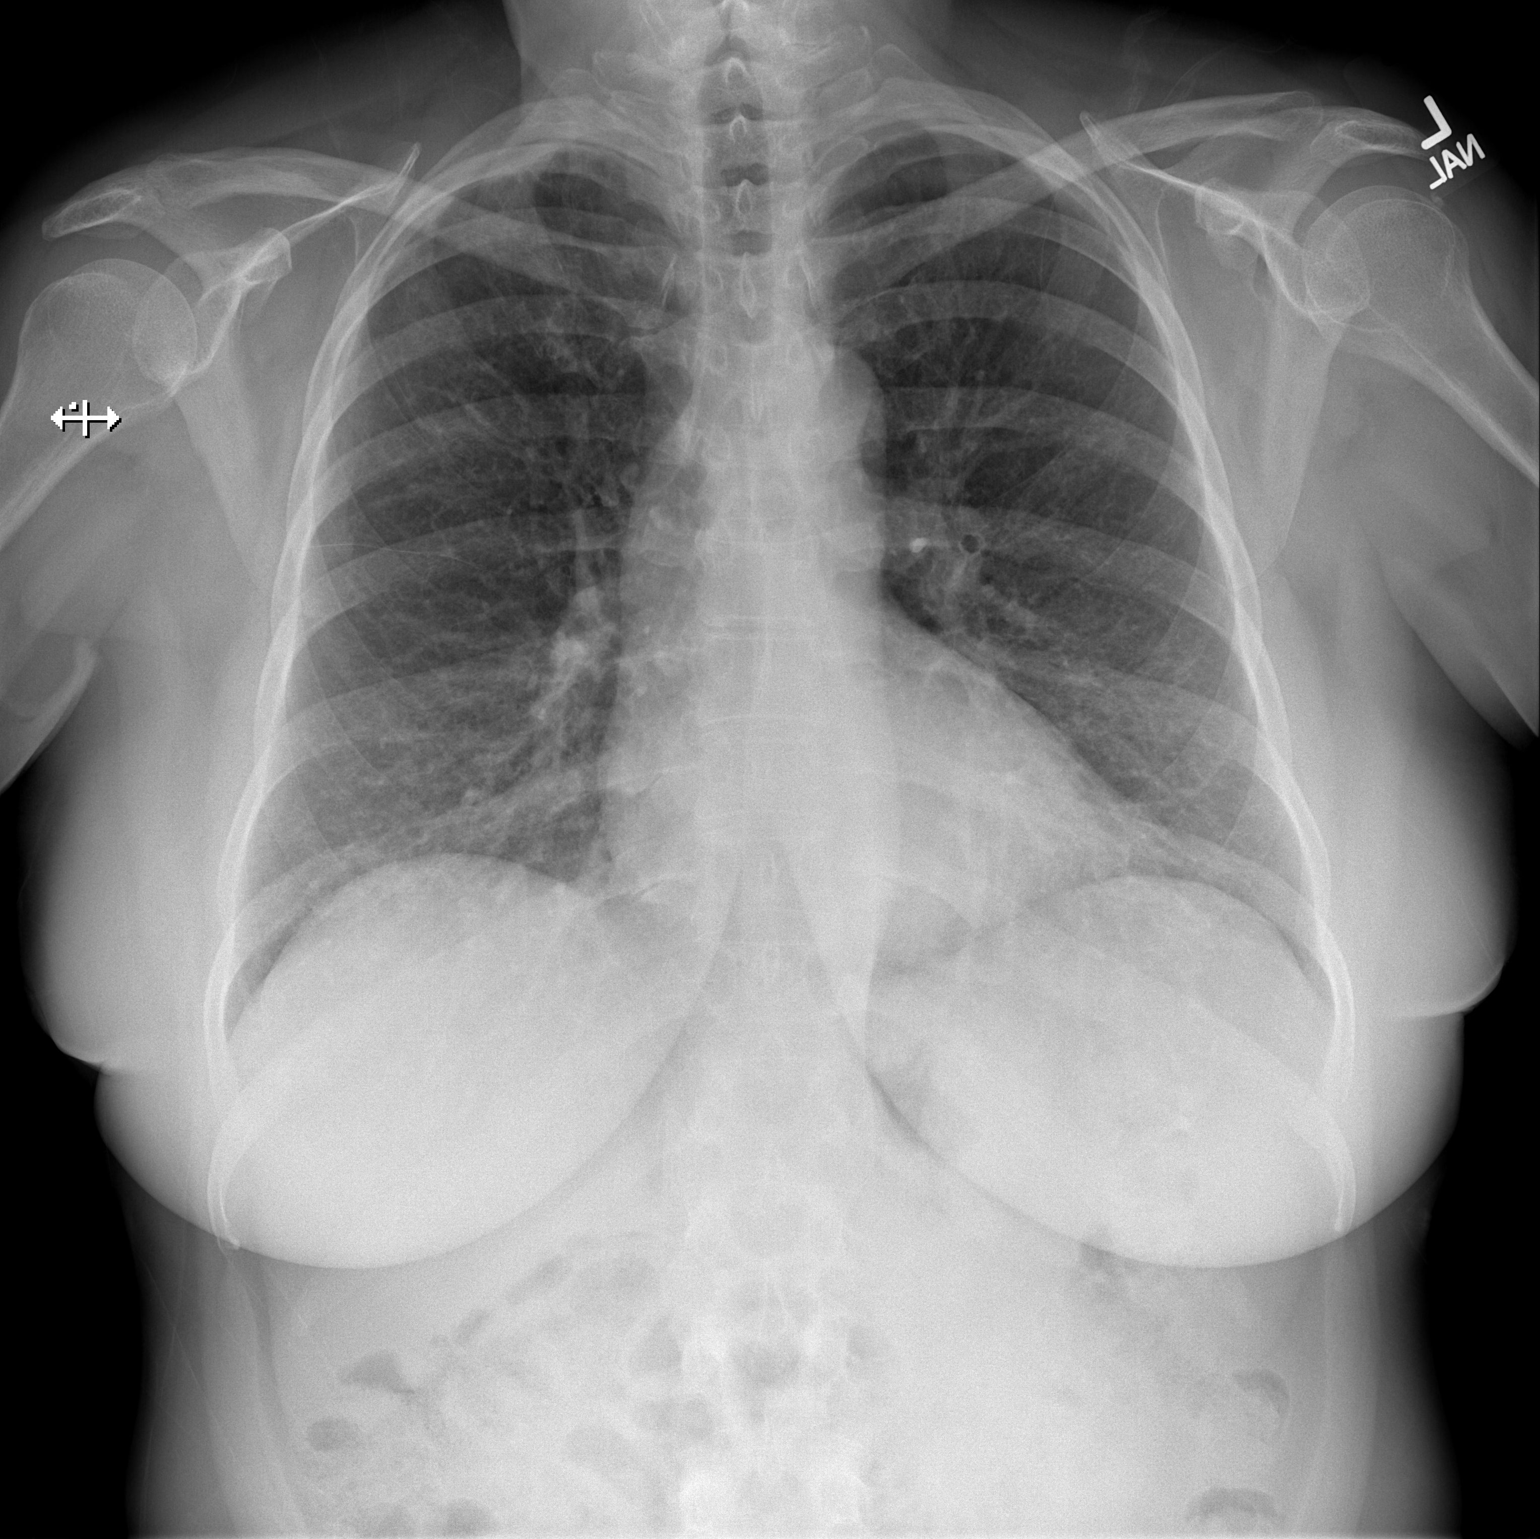
[im 2/2]
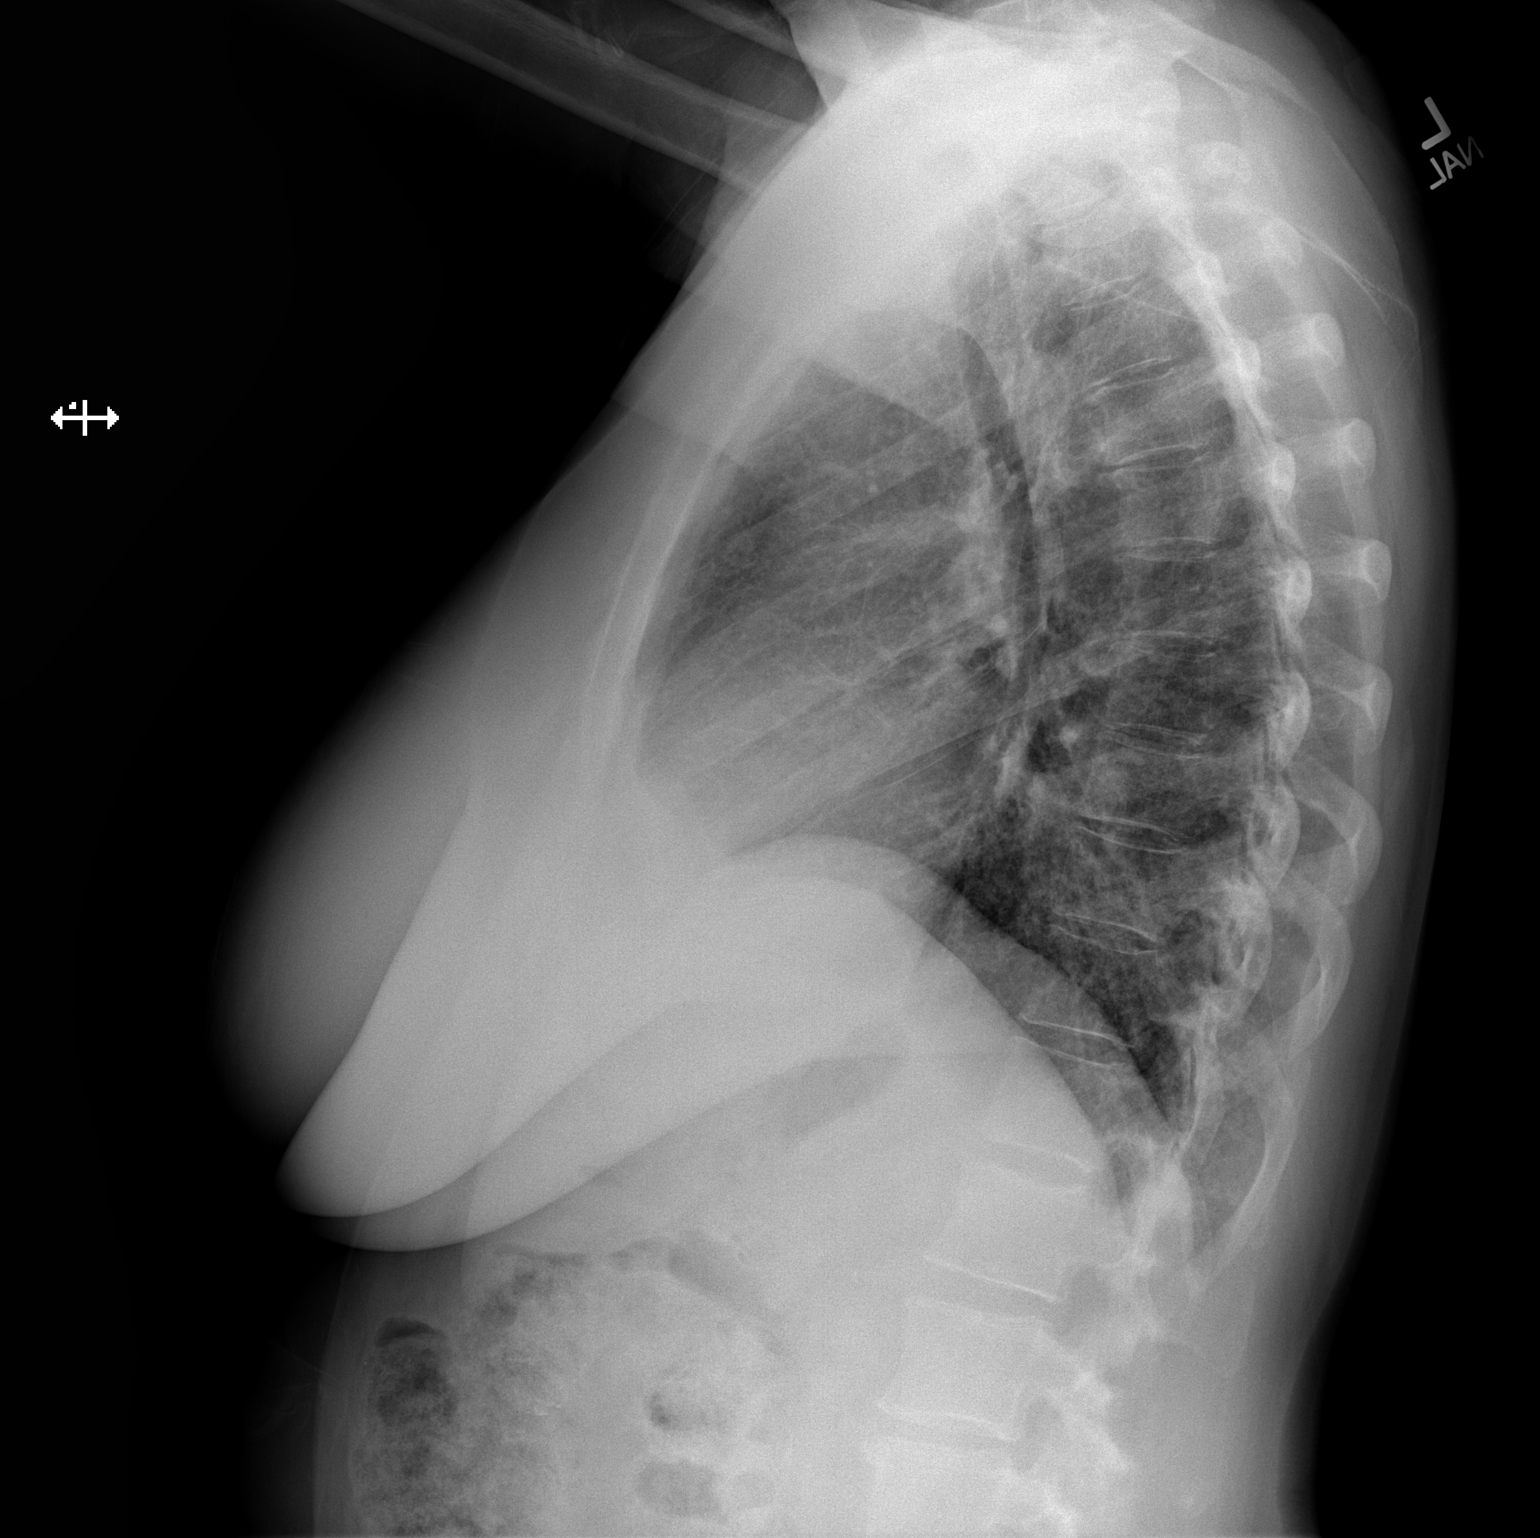

[2 of 2 positions shown; findings below may reference images not displayed]

FINDINGS: Normal heart size, mediastinal contours and pulmonary vascularity.

Peribronchial thickening with minimal RIGHT apex scarring.

Question subtle RIGHT basilar infiltrate, probably in RIGHT lower
lobe on lateral view.

Remaining lungs clear.

No pleural effusion or pneumothorax.
IMPRESSION: Bronchitic changes with questionable still RIGHT lower lobe
infiltrate.

## 2016-10-01 ENCOUNTER — Ambulatory Visit
Admission: RE | Admit: 2016-10-01 | Discharge: 2016-10-01 | Disposition: A | Discharge status: Home / Self Care | Payer: BLUE CROSS/BLUE SHIELD | Source: Ambulatory Visit | Attending: Nurse Practitioner | Admitting: Nurse Practitioner

## 2016-10-01 ENCOUNTER — Other Ambulatory Visit: Payer: Self-pay | Admitting: Pulmonary Disease

## 2016-10-01 ENCOUNTER — Other Ambulatory Visit: Payer: Self-pay | Admitting: Nurse Practitioner

## 2016-10-01 ENCOUNTER — Other Ambulatory Visit
Admission: RE | Admit: 2016-10-01 | Discharge: 2016-10-01 | Disposition: A | Discharge status: Home / Self Care | Payer: BLUE CROSS/BLUE SHIELD | Source: Ambulatory Visit | Attending: Nurse Practitioner | Admitting: Nurse Practitioner

## 2016-10-01 DIAGNOSIS — I700 Atherosclerosis of aorta: Secondary | ICD-10-CM | POA: Diagnosis not present

## 2016-10-01 DIAGNOSIS — R197 Diarrhea, unspecified: Secondary | ICD-10-CM | POA: Insufficient documentation

## 2016-10-01 DIAGNOSIS — Z860101 Personal history of adenomatous and serrated colon polyps: Secondary | ICD-10-CM | POA: Insufficient documentation

## 2016-10-01 DIAGNOSIS — R1031 Right lower quadrant pain: Secondary | ICD-10-CM

## 2016-10-01 DIAGNOSIS — Z86010 Personal history of colonic polyps: Secondary | ICD-10-CM | POA: Insufficient documentation

## 2016-10-01 LAB — GASTROINTESTINAL PANEL BY PCR, STOOL (REPLACES STOOL CULTURE)

## 2016-10-01 LAB — C DIFFICILE QUICK SCREEN W PCR REFLEX
C Diff antigen: NEGATIVE
C Diff interpretation: NOT DETECTED
C Diff toxin: NEGATIVE

## 2016-10-01 MED ORDER — IOPAMIDOL (ISOVUE-300) INJECTION 61%
100.0000 mL | Freq: Once | INTRAVENOUS | Status: AC | PRN
  Administered 2016-10-01: 100 mL via INTRAVENOUS

## 2016-10-05 IMAGING — CT CT CHEST W/O CM
2 of 5 series · 14 of 36 positions shown, 17 images · non-contrast
Comparison: No priors.

CLINICAL DATA: 58-year-old female with persistent cough for the
past 5 months, sometimes associated with vomiting. Evaluate for
potential bronchiectasis. History of partial left thyroidectomy for
benign tumors.

EXAM:
CT CHEST WITHOUT CONTRAST
TECHNIQUE: Multidetector CT imaging of the chest was performed following the
standard protocol without IV contrast..

[Series 2: routine chest wo · axial · 0.64mm/px · z∈[-632,-388]mm · 11 of 55 slices shown, 14 images]
[im 3/55  mediastinal]
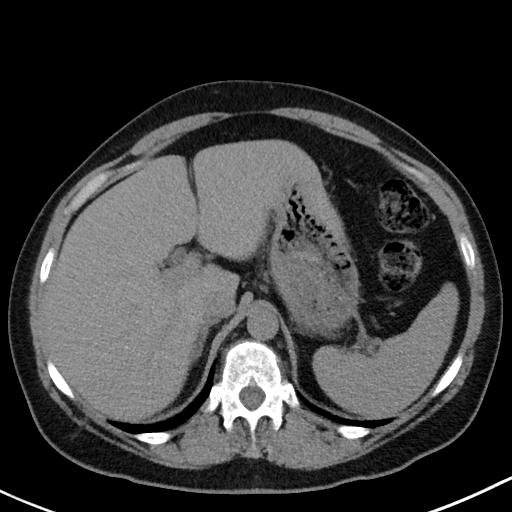
[im 3/55  lung]
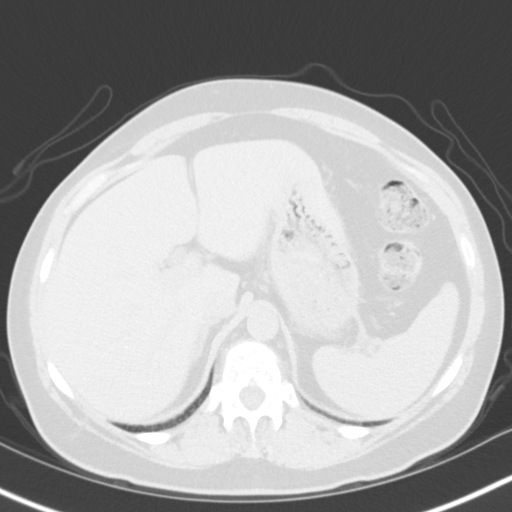
[im 9/55  lung]
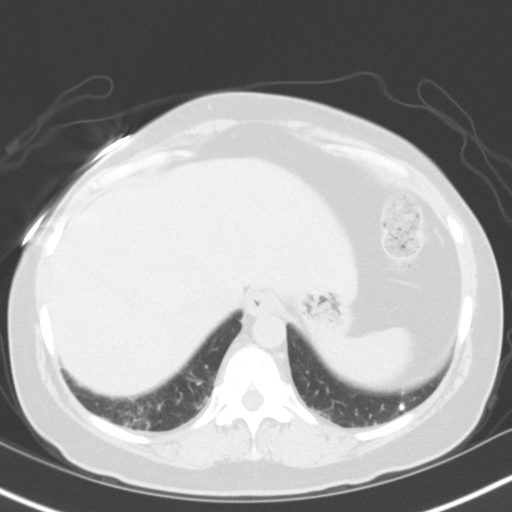
[im 15/55  lung]
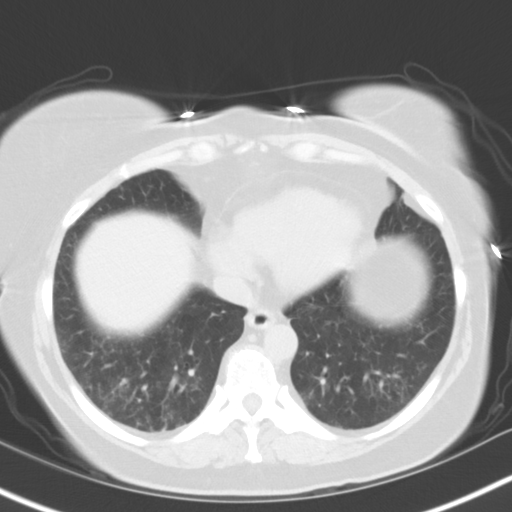
[im 18/55  lung]
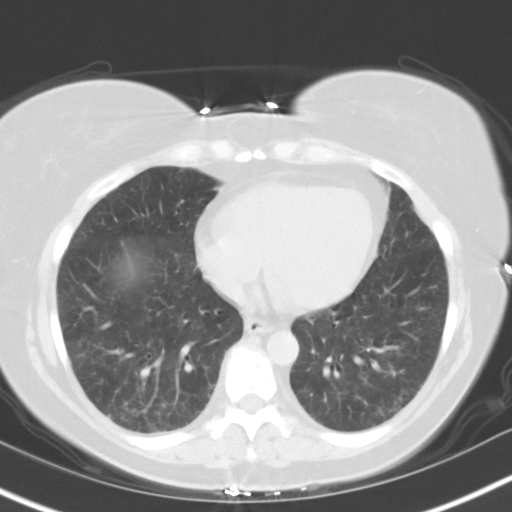
[im 23/55  mediastinal]
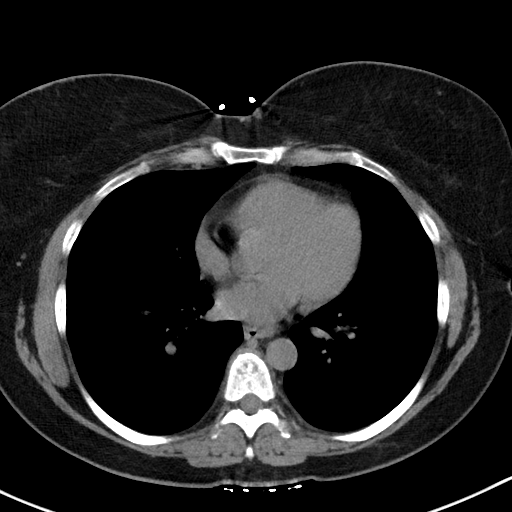
[im 23/55  lung]
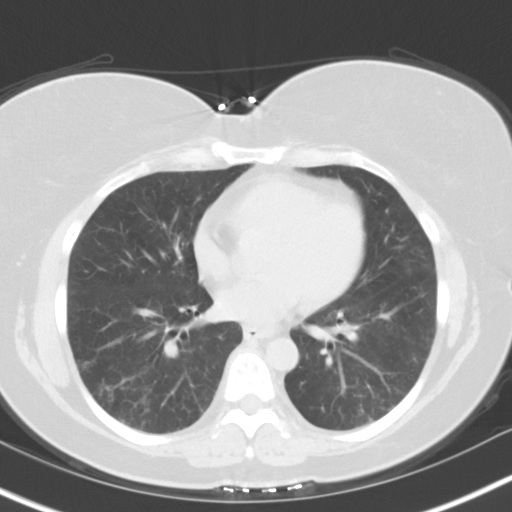
[im 29/55  lung]
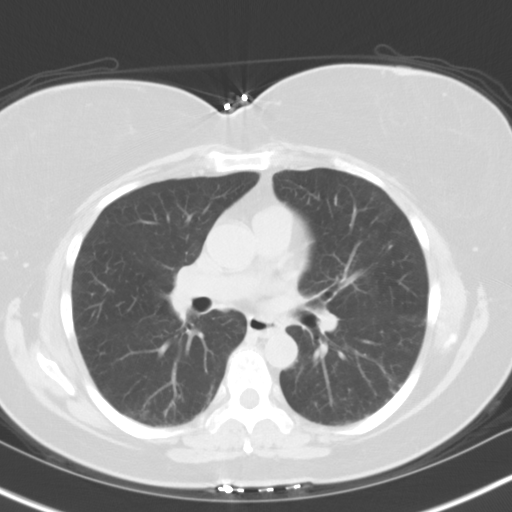
[im 32/55  lung]
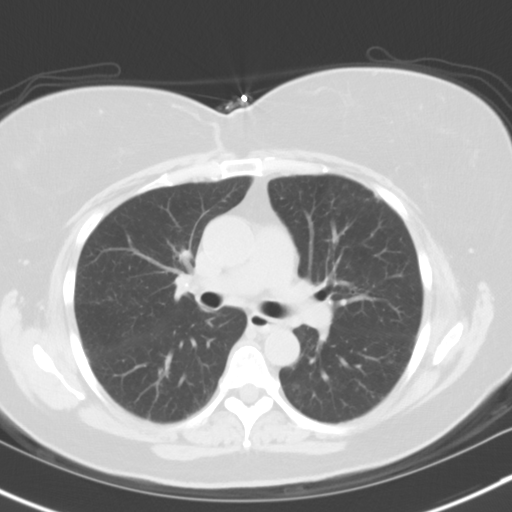
[im 37/55  lung]
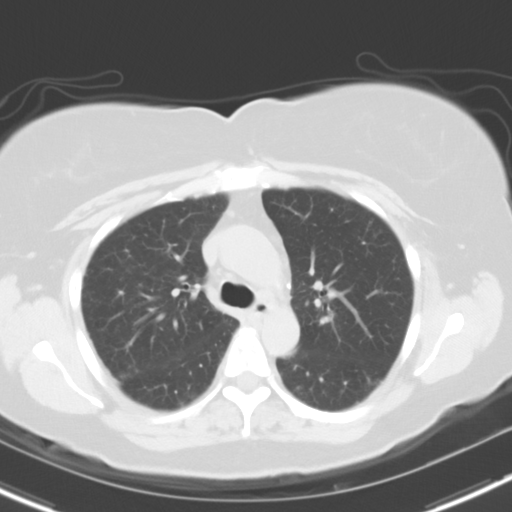
[im 40/55  mediastinal]
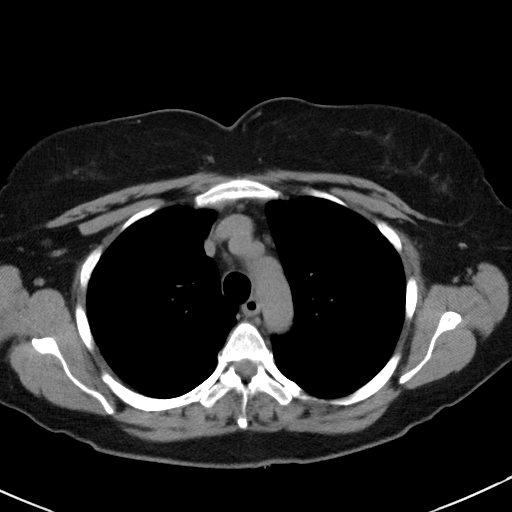
[im 40/55  lung]
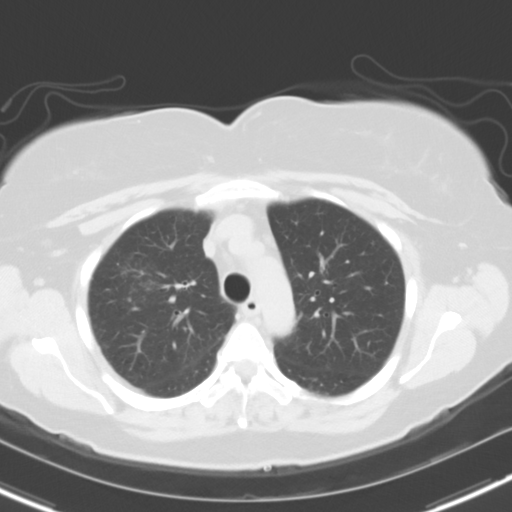
[im 46/55  lung]
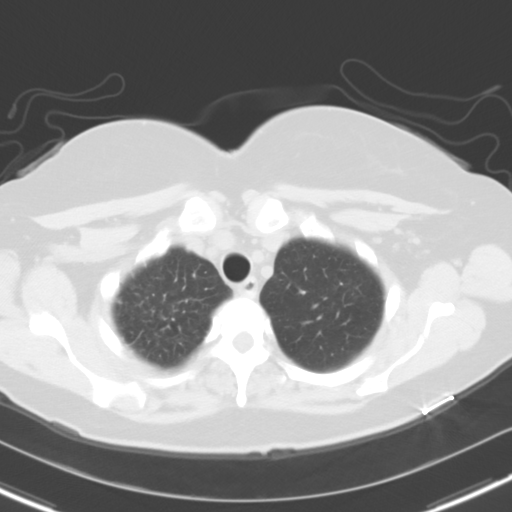
[im 52/55  lung]
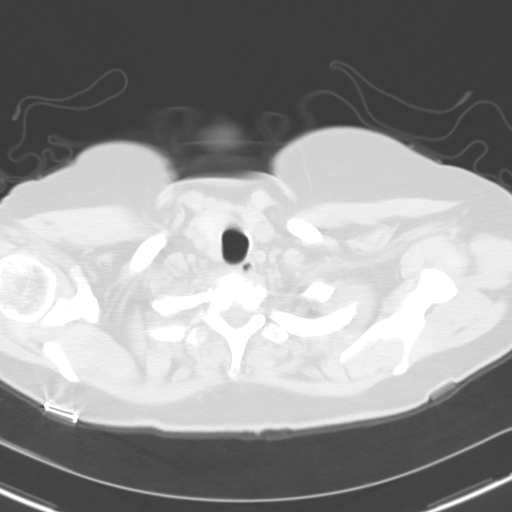

[Series 8: cor routine chest wo · coronal · 0.55mm/px · 3 of 142 slices shown]
[im 29/142  lung]
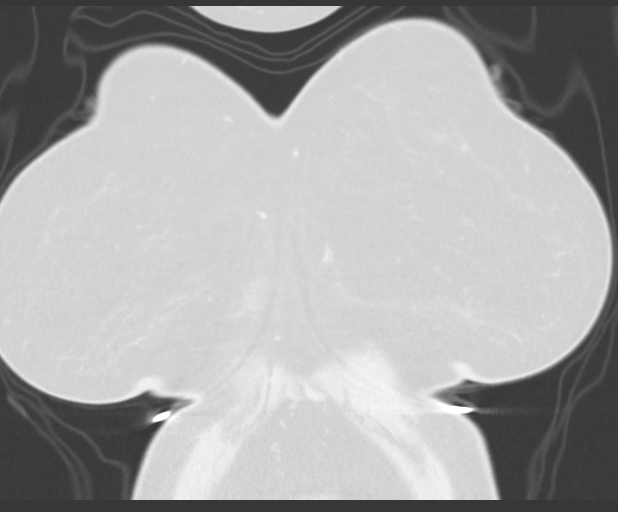
[im 57/142  lung]
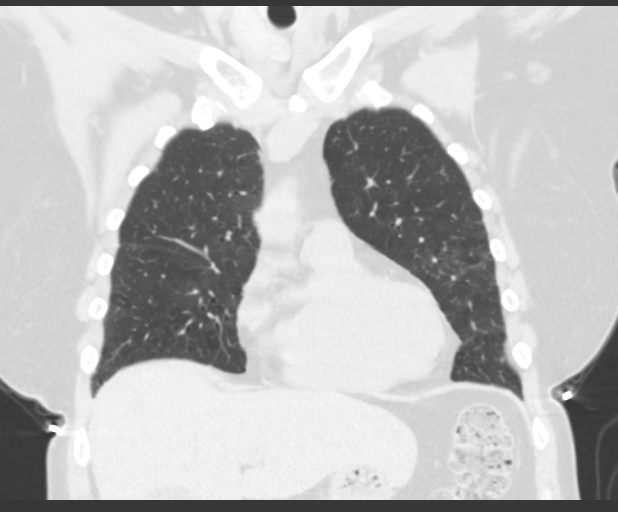
[im 85/142  lung]
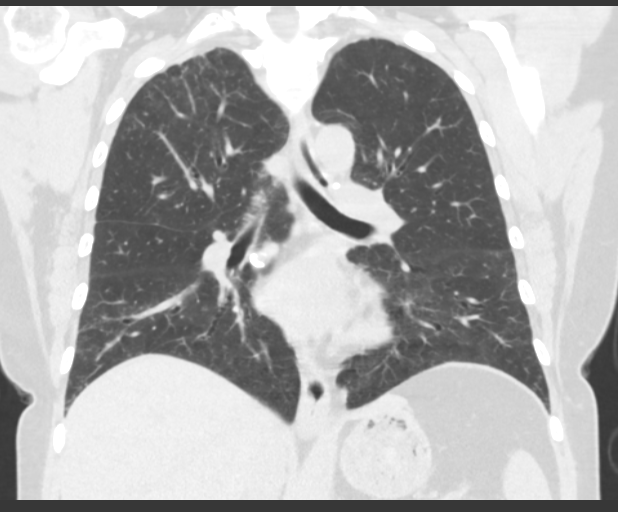

[14 of 36 positions shown; findings below may reference images not displayed]

FINDINGS: Mediastinum/Lymph Nodes: Heart size is normal. There is no
significant pericardial fluid, thickening or pericardial
calcification. Numerous borderline enlarged mediastinal and hilar
lymph nodes are noted, but are nonspecific. Many of these lymph
nodes are densely calcified. No definite pathologically enlarged
mediastinal are hilar lymph nodes are noted. Esophagus is
unremarkable in appearance. No axillary lymphadenopathy.

Lungs/Pleura: High-resolution images demonstrate diffuse bronchial
wall thickening with patchy areas of thickening of the
peribronchovascular interstitium and some mild peribronchovascular
ground-glass attenuation. In the areas of greatest involvement
(lower lobes of the lungs bilaterally) there is some associated
peribronchovascular ground-glass attenuation micronodularity was
some very mild cylindrical bronchiectasis and peripheral
bronchiolectasis. This is most extensive in the right lower lobe as
demonstrated on image 24 of series 4. No larger more suspicious
appearing pulmonary nodules or masses are otherwise noted. No acute
consolidative airspace disease. No pleural effusions. Inspiratory
and expiratory imaging demonstrates air trapping, indicative move of
moderate small airways disease. Small calcified granuloma in the
posterior aspect of the left lower lobe measuring 4 mm incidentally
noted.

Upper Abdomen: Unremarkable.

Musculoskeletal/Soft Tissues: There are no aggressive appearing
lytic or blastic lesions noted in the visualized portions of the
skeleton.
IMPRESSION: 1. Spectrum of findings in the lungs, as detailed above suggestive
interstitial lung disease. The overall pattern is favored to reflect
nonspecific interstitial pneumonia (NSIP). At this time, there are
no specific imaging features suggestive of usual interstitial
pneumonia (UIP), however, there is a rather strong craniocaudal
gradient. Repeat high-resolution chest CT in 1 year would be useful
to assess for temporal changes in the appearance of the lung
parenchyma if clinically appropriate.
2. Although there is some very mild cylindrical bronchiectasis and
peripheral bronchiolectasis as part of this underlying interstitial
lung disease, there are no more extensive regions of varicose or
cystic bronchiectasis noted.
3. Sequela of old granulomatous disease, as above.

## 2016-10-30 ENCOUNTER — Other Ambulatory Visit: Payer: Self-pay | Admitting: Pulmonary Disease

## 2016-11-19 DIAGNOSIS — I700 Atherosclerosis of aorta: Secondary | ICD-10-CM | POA: Insufficient documentation

## 2016-11-26 ENCOUNTER — Other Ambulatory Visit: Payer: Self-pay | Admitting: Pulmonary Disease

## 2017-01-28 ENCOUNTER — Other Ambulatory Visit: Payer: Self-pay | Admitting: Pulmonary Disease

## 2017-01-29 ENCOUNTER — Telehealth: Payer: Self-pay | Admitting: Pulmonary Disease

## 2017-01-29 NOTE — Telephone Encounter (Signed)
Spoke with pt, who states she received a call from [name] with a [employer]. Pt also states she did not receive a letter to call and schedule 1 year f/u. Pt has been scheduled for 04/03/17 with BQ. Pt request that she be contacted on her work number at [phone].  Will route to [name] to f/u on.

## 2017-01-30 NOTE — Telephone Encounter (Signed)
[name] please advise. Thanks

## 2017-02-10 NOTE — Telephone Encounter (Signed)
Called spoke with [name] w/ [employer] to ask her to please have [name] check her inbox

## 2017-02-14 NOTE — Telephone Encounter (Signed)
[name] please advise if we may close this message.  Thanks

## 2017-03-27 ENCOUNTER — Ambulatory Visit (INDEPENDENT_AMBULATORY_CARE_PROVIDER_SITE_OTHER): Payer: Self-pay

## 2017-03-27 ENCOUNTER — Ambulatory Visit
Admission: EM | Admit: 2017-03-27 | Discharge: 2017-03-27 | Disposition: A | Discharge status: Home / Self Care | Payer: Self-pay | Attending: Emergency Medicine | Admitting: Emergency Medicine

## 2017-03-27 ENCOUNTER — Encounter: Payer: Self-pay | Admitting: Emergency Medicine

## 2017-03-27 DIAGNOSIS — M79641 Pain in right hand: Secondary | ICD-10-CM

## 2017-03-27 DIAGNOSIS — T07XXXA Unspecified multiple injuries, initial encounter: Secondary | ICD-10-CM

## 2017-03-27 DIAGNOSIS — M5136 Other intervertebral disc degeneration, lumbar region: Secondary | ICD-10-CM

## 2017-03-27 DIAGNOSIS — W19XXXA Unspecified fall, initial encounter: Secondary | ICD-10-CM

## 2017-03-27 DIAGNOSIS — M545 Low back pain: Secondary | ICD-10-CM

## 2017-03-27 MED ORDER — NAPROXEN 500 MG PO TABS: 500.0000 mg | ORAL_TABLET | Freq: Two times a day (BID) | ORAL | 0 refills | Status: DC

## 2017-03-27 NOTE — ED Provider Notes (Signed)
[facility]    CSN: [id number] Arrival date & time: 03/27/17  1001     History   Chief Complaint Chief Complaint  Patient presents with  . Fall  . Back Pain    HPI Jill Crawford is a 61 y.o. female.   HPI  This 61 year old female who presents after she was injured at work about 9 AM this morning. She states that she was depositing a empty water bottle in a recycling bin when she backed up and caught her heel on a rug that had turned up on the edge. She lost her balance falling backwards landing onto her right buttock. In the fall she also injured her left ulnar hand when she hit a chair struck her right arm on a desk. She did not strike her head had no loss of consciousness. She is accompanied by a Ricki Miller, who is in the room and the patient has given permission to allow that person to hear her entire medical history and observe the examination. He denies any other symptoms into her legs. She does have a history of sciatica that she had for 6 months but has finally cleared up prior to this incident.         Past Medical History:  Diagnosis Date  . Hypercholesteremia   . Hypertension   . Seasonal allergies     Patient Active Problem List   Diagnosis Date Noted  . Acute bronchitis 07/14/2015  . Vocal cord dysfunction 04/27/2015  . Cough 08/18/2014  . Abnormal CT scan, chest 08/18/2014    Past Surgical History:  Procedure Laterality Date  . CARPAL TUNNEL RELEASE    . THYROID SURGERY    . TONSILLECTOMY      OB History    No data available       Home Medications    Prior to Admission medications   Medication Sig Start Date End Date Taking? Authorizing Provider  Acetylcarnitine HCl (ACETYL L-CARNITINE) 500 MG CAPS Take 1 capsule by mouth daily.    [provider]  albuterol (PROVENTIL HFA;VENTOLIN HFA) 108 (90 BASE) MCG/ACT inhaler Inhale 2 puffs into the lungs every 6 (six) hours as needed for wheezing or  shortness of breath.    [provider]  Alpha-Lipoic Acid 200 MG CAPS Take 1 capsule by mouth daily.    [provider]  aspirin 81 MG tablet Take 81 mg by mouth daily.    [provider]  azelastine (ASTELIN) 0.1 % nasal spray Place 2 sprays into both nostrils 2 (two) times daily. Use in each nostril as directed 02/22/15   Lupita Leash, MD  b complex vitamins capsule Take 1 capsule by mouth daily.    [provider]  Calcium Carbonate-Vitamin D (CALCIUM-VITAMIN D) 500-200 MG-UNIT per tablet Take 1 tablet by mouth 2 (two) times daily.    [provider]  cetirizine (ZYRTEC) 10 MG tablet Take 10 mg by mouth daily.    [provider]  chlorpheniramine (CHLOR-TRIMETON) 4 MG tablet Take 4 mg by mouth 2 (two) times daily as needed for allergies.    [provider]  Cholecalciferol (VITAMIN D3) 2000 UNITS TABS Take 1 tablet by mouth daily.    [provider]  Coenzyme Q10 (CO Q 10) 100 MG CAPS Take 1 capsule by mouth daily.    [provider]  docusate sodium (COLACE) 100 MG capsule Take 100 mg by mouth 3 (three) times daily.    [provider]  Flaxseed, Linseed, (FLAXSEED OIL) 1000 MG CAPS Take 1 capsule by mouth daily.    [provider]  gabapentin (NEURONTIN) 300 MG capsule TAKE 2 CAPSULES BY MOUTH 3 TIMES DAILY 11/26/16   Lupita LeashMcQuaid, Douglas B, MD  Garlic 1000 MG CAPS Take 1 capsule by mouth daily.    [provider]  Glucos-Chond-Hyal Ac-Ca Fructo (MOVE FREE JOINT HEALTH ADVANCE PO) Take 1 capsule by mouth daily.    [provider]  Grape Seed Extract 100 MG CAPS Take 1 capsule by mouth daily.    [provider]  L-Lysine 500 MG TABS Take 1 tablet by mouth daily.    [provider]  levothyroxine (SYNTHROID, LEVOTHROID) 25 MCG tablet Take 25 mcg by mouth daily before breakfast.    [provider]  mometasone (NASONEX) 50 MCG/ACT nasal spray Place 2  sprays into the nose daily.    [provider]  montelukast (SINGULAIR) 10 MG tablet TAKE ONE TABLET BY MOUTH AT BEDTIME 10/01/16   Lupita LeashMcQuaid, Douglas B, MD  Multiple Vitamin (MULTIVITAMIN WITH MINERALS) TABS tablet Take 1 tablet by mouth daily.    [provider]  naproxen (NAPROSYN) 500 MG tablet Take 1 tablet (500 mg total) by mouth 2 (two) times daily with a meal. 03/27/17   Lutricia Feiloemer, William P, PA-C  Omega-3 Fatty Acids (FISH OIL) 1200 MG CAPS Take 1 capsule by mouth daily.    [provider]  omeprazole (PRILOSEC) 40 MG capsule TAKE 1 CAPSULE BY MOUTH EVERY DAY 10/30/16   Lupita LeashMcQuaid, Douglas B, MD  Probiotic Product (PROBIOTIC ADVANCED PO) Take 1 tablet by mouth daily.    [provider]  Respiratory Therapy Supplies (FLUTTER) DEVI Use daily as directed 08/23/14   Lupita LeashMcQuaid, Douglas B, MD  rosuvastatin (CRESTOR) 10 MG tablet Take 10 mg by mouth daily.    [provider]  traMADol (ULTRAM) 50 MG tablet Take 1 tablet (50 mg total) by mouth every 6 (six) hours as needed (cough). 07/19/16   Lupita LeashMcQuaid, Douglas B, MD  verapamil (VERELAN PM) 240 MG 24 hr capsule Take 240 mg by mouth 2 (two) times daily.    [provider]  vitamin C (ASCORBIC ACID) 500 MG tablet Take 500 mg by mouth daily.    [provider]    Family History Family History  Problem Relation Age of Onset  . Heart disease Father   . Colon cancer Father   . Hypertension Mother   . Breast cancer Neg Hx     Social History Social History  Substance Use Topics  . Smoking status: Never Smoker  . Smokeless tobacco: Never Used     Comment: exposed to secondhand smoke growing up.   . Alcohol use No     Allergies   Levaquin [levofloxacin in d5w]; Penicillins; Sulfa antibiotics; and Aspirin   Review of Systems Review of Systems  Constitutional: Positive for activity change. Negative for chills, fatigue and fever.  All other systems reviewed and are negative.    Physical  Exam Triage Vital Signs ED Triage Vitals  Enc Vitals Group     BP 03/27/17 1024 134/71     Pulse Rate 03/27/17 1024 72     Resp 03/27/17 1024 16     Temp 03/27/17 1024 97.8 F (36.6 C)     Temp Source 03/27/17 1024 Oral     SpO2 03/27/17 1024 95 %     Weight 03/27/17 1023 165 lb (74.8 kg)     Height 03/27/17 1023   (1.651 m)     Head Circumference --      Peak Flow --      Pain Score 03/27/17 1023 1     Pain Loc --      Pain Edu? --      Excl. in GC? --    No data found.   Updated Vital Signs BP 134/71 (BP Location: Right Arm)   Pulse 72   Temp 97.8 F (36.6 C) (Oral)   Resp 16   Ht  (1.651 m)   Wt 165 lb (74.8 kg)   SpO2 95%   BMI 27.46 kg/m   Visual Acuity Right Eye Distance:   Left Eye Distance:   Bilateral Distance:    Right Eye Near:   Left Eye Near:    Bilateral Near:     Physical Exam  Constitutional: She appears well-developed and well-nourished. No distress.  HENT:  Head: Normocephalic.  Eyes: Pupils are equal, round, and reactive to light.  Neck: Normal range of motion.  Musculoskeletal:  Examination of the lumbar spine was performed in the presence of Heather, RN as Nurse, children's. Patient has a level pelvis in stance. She has a small amount of ecchymosis over the lower lumbar segments and sacrum. She is able to forward flex  her hands at the level of her ankle. Returning to the upright position position is more painful. Lateral flexion is comfortable right lateral flexion causes pain in the lumbosacral area. She is able to toe and heel walk adequately. DTRs are 2+ over 4 and symmetrical. Sensation is intact to light touch throughout the lower extremities. EHL peroneal and anterior tibialis muscles are strong to clinical testing. Straight leg raise testing in the seated position at 90 is negative bi laterally. Admission of the left nondominant hand shows no ecchymosis of ecchymosis erythema or break in the skin. Maximum tenderness is in  the web space between the fifth and fourth fingers. Examination of the right forearm shows no ecchymosis erythema or breaks in the skin. Maximal tenderness is in the brachioradialis muscle belly ulnarly. She has full range of motion of her wrist fingers elbow and shoulder.  Skin: She is not diaphoretic.  Nursing note and vitals reviewed.    UC Treatments / Results  Labs (all labs ordered are listed, but only abnormal results are displayed) Labs Reviewed - No data to display  EKG  EKG Interpretation None       Radiology Dg Lumbar Spine Complete  Result Date: 03/27/2017 CLINICAL DATA:  Larey Seat today with low back pain EXAM: LUMBAR SPINE - COMPLETE 4+ VIEW COMPARISON:  CT abdomen pelvis of 10/01/2016 FINDINGS: The lumbar vertebrae remain in normal alignment. Normal variation of limbus vertebra is present at L4. Degenerative disc disease is noted at L4-5 and to a lesser degree T12-L1. No acute compression deformity is seen. A moderate amount of feces is noted throughout the colon. The SI joints appear corticated. IMPRESSION: Normal alignment. No acute compression deformity. Degenerative disc disease at L4-5 and T12-L1. Electronically Signed   By: Dwyane Dee M.D.   On: 03/27/2017 11:09    Procedures Procedures (including critical care time)  Medications Ordered in UC Medications - No data to display   Initial Impression / Assessment and Plan / UC Course  I have reviewed the triage vital signs and the nursing notes.  Pertinent labs & imaging results that were available during my care of the patient were reviewed by me and considered in  my medical decision making (see chart for details).     Plan: 1. Test/x-ray results and diagnosis reviewed with patient 2. rx as per orders; risks, benefits, potential side effects reviewed with patient 3. Recommend supportive treatment with Rest and symptom avoidance. Use ice on all areas of pain primarily minutes out of every 2 hours 45 times daily.  Naprosyn for pain making sure you take this with meals. Follow-up with your primary care physician if you are not improving. 4. F/u prn if symptoms worsen or don't improve   Final Clinical Impressions(s) / UC Diagnoses   Final diagnoses:  DDD (degenerative disc disease), lumbar  Multiple contusions    New Prescriptions New Prescriptions   NAPROXEN (NAPROSYN) 500 MG TABLET    Take 1 tablet (500 mg total) by mouth 2 (two) times daily with a meal.     Controlled Substance Prescriptions [place] Controlled Substance Registry consulted? Not Applicable   Lutricia Feil, PA-C 03/27/17 1129

## 2017-03-27 NOTE — ED Triage Notes (Signed)
Patient states that at 9am while at work she stepped back and fell and landed on her right hip.  Patient states that she hit her right hand on a desk or chair.  Patient c/o right hand and lower back pain.

## 2017-04-03 ENCOUNTER — Ambulatory Visit (INDEPENDENT_AMBULATORY_CARE_PROVIDER_SITE_OTHER)
Admission: RE | Admit: 2017-04-03 | Discharge: 2017-04-03 | Disposition: A | Discharge status: Home / Self Care | Payer: BLUE CROSS/BLUE SHIELD | Source: Ambulatory Visit | Attending: Pulmonary Disease | Admitting: Pulmonary Disease

## 2017-04-03 ENCOUNTER — Encounter: Payer: Self-pay | Admitting: Pulmonary Disease

## 2017-04-03 ENCOUNTER — Ambulatory Visit (INDEPENDENT_AMBULATORY_CARE_PROVIDER_SITE_OTHER): Payer: BLUE CROSS/BLUE SHIELD | Admitting: Pulmonary Disease

## 2017-04-03 VITALS — BP 130/80 | HR 69 | Ht 65.0 in | Wt 167.6 lb

## 2017-04-03 DIAGNOSIS — R05 Cough: Secondary | ICD-10-CM

## 2017-04-03 DIAGNOSIS — R059 Cough, unspecified: Secondary | ICD-10-CM

## 2017-04-03 MED ORDER — GABAPENTIN 300 MG PO CAPS: 900.0000 mg | ORAL_CAPSULE | Freq: Three times a day (TID) | ORAL | 2 refills | Status: DC

## 2017-04-03 NOTE — Patient Instructions (Addendum)
For cough: Increase gabapentin to 900 mg 3 times a day We will check a CXR Take prilosec every day We will see you back in 4 weeks or sooner if needed,NP visit OK

## 2017-04-03 NOTE — Progress Notes (Signed)
Subjective:    Patient ID: Jill Crawford, female    DOB: 04-13-1956, 61 y.o.   MRN: [id number]    Synopsis: Jill Crawford was referred to the [facility] pulmonary clinic in 2016 for evaluation of cough and dyspnea.  She had a history of recurrent bronchitis and a very minimal smoking history (less than one week in college).  PFT in 2016 was normal, CXR showed bronchitis.  08/2014 CT chest ([place] read)> patchy areas of peribronchovascular thickening and ggo worrisome for NSIP, doesn't look like UIP but there is a strong cranio-caudal gradient, mild bronchiectasis     HPI Chief Complaint  Patient presents with  . Follow-up    Pt sees no changes. Pt still having productive cough daily with coughing up yellow most times.    Jill Crawford is still coughing a lot.  She still has frequent coughing attacks which last about one minute at a time.  She says that sometimes the cough is productive, but rarely.  Food sometimes makes it worse.  She will have severe attacks sometimes which will causing her to get short of breath and gasp for air.  She hsa some seasonal allergies which isn't too bad.  Some runny nose.  No GERD.   No respiratory complaints otherwise, no respiratory infections.   The gabapentin still helps her quite a bit. She says that her coughing gets worse when she misses gabapentin.    She is taking nasacort.  She still takes the zyrtec and and singulair.  She has taken the prilosec every other day, reports no GERD.    Past Medical History:  Diagnosis Date  . Hypercholesteremia   . Hypertension   . Seasonal allergies      Review of Systems  Constitutional: Positive for fatigue. Negative for chills and fever.  HENT: Negative for rhinorrhea, sinus pressure and sneezing.   Respiratory: Positive for cough. Negative for shortness of breath and wheezing.   Cardiovascular: Negative for chest pain, palpitations and leg swelling.       Objective:   Physical  Exam  Vitals:   04/03/17 1602  BP: 130/80  Pulse: 69  SpO2: 96%  Weight: 167 lb 9.6 oz (76 kg)  Height:  (1.651 m)  RA  Gen: well appearing HENT: OP clear, TM's clear, neck supple PULM: CTA B, normal percussion CV: RRR, no mgr, trace edema GI: BS+, soft, nontender Derm: no cyanosis or rash Psyche: normal mood and affect     PFT: October 2016 pulmonary function testing ratio 90%, FEV1 2.43 L (93% predicted) this he is, total lung capacity 4.45 L (88% predicted), DLCO 23.92 (98% predicted). April 2017 pulmonary function testing ratio 88%, FVC 2.69 L, total lung capacity 4.83 L, DLCO 21.66 (89% predicted)  Chest imaging: April 2017 high-resolution CT scan. Findings previously worrisome for NSIP have now completely resolved. No evidence of underlying pulmonary parenchymal disease.      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 There is no clear trigger other than the fact that perhaps she dropped her dosing of antacid therapy to every other day. Despite this she does not have any GI complaints but we know that patients can have silent aspiration that just causes cough. She does not have any significant sinus symptoms right now. No recent respiratory infections. 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 We will check a chest x-ray today to make sure there is nothing else going on. I will increase the dose of gabapentin to 900 mg 3 times a day. However, if this combined with taking Prilosec daily does not help her then we may want to consider enrollment in a clinical trial for an investigational agent for chronic refractory cough.  Plan: For cough: Increase gabapentin to 900 mg 3 times a day We will check a CXR Take prilosec every day We will see you back in 4 weeks or sooner if needed,NP visit OK    Current  Outpatient Prescriptions:  .  Acetylcarnitine HCl (ACETYL L-CARNITINE) 500 MG CAPS, Take 1 capsule by mouth daily., Disp: , Rfl:  .  albuterol (PROVENTIL HFA;VENTOLIN HFA) 108 (90 BASE) MCG/ACT inhaler, Inhale 2 puffs into the lungs every 6 (six) hours as needed for wheezing or shortness of breath., Disp: , Rfl:  .  Alpha-Lipoic Acid 200 MG CAPS, Take 1 capsule by mouth daily., Disp: , Rfl:  .  aspirin 81 MG tablet, Take 81 mg by mouth daily., Disp: , Rfl:  .  b complex vitamins capsule, Take 1 capsule by mouth daily., Disp: , Rfl:  .  Calcium Carbonate-Vitamin D (CALCIUM-VITAMIN D) 500-200 MG-UNIT per tablet, Take 1 tablet by mouth 2 (two) times daily., Disp: , Rfl:  .  cetirizine (ZYRTEC) 10 MG tablet, Take 10 mg by mouth daily., Disp: , Rfl:  .  chlorpheniramine (CHLOR-TRIMETON) 4 MG tablet, Take 4 mg by mouth 2 (two) times daily as needed for allergies., Disp: , Rfl:  .  Cholecalciferol (VITAMIN D3) 2000 UNITS TABS, Take 1 tablet by mouth daily., Disp: , Rfl:  .  Coenzyme Q10 (CO Q 10) 100 MG CAPS, Take 1 capsule by mouth daily., Disp: , Rfl:  .  Flaxseed, Linseed, (FLAXSEED OIL) 1000 MG CAPS, Take 1 capsule by mouth daily., Disp: , Rfl:  .  gabapentin (NEURONTIN) 300 MG capsule, TAKE 2 CAPSULES BY MOUTH 3 TIMES DAILY, Disp: 180 capsule, Rfl: 1 .  Garlic 1000 MG CAPS, Take 1 capsule by mouth daily., Disp: , Rfl:  .  Glucos-Chond-Hyal Ac-Ca Fructo (MOVE FREE JOINT HEALTH ADVANCE PO), Take 1 capsule by mouth daily., Disp: , Rfl:  .  Grape Seed Extract 100 MG CAPS, Take 1 capsule by mouth daily., Disp: , Rfl:  .  L-Lysine 500 MG TABS, Take 1 tablet by mouth daily., Disp: , Rfl:  .  levothyroxine (SYNTHROID, LEVOTHROID) 25 MCG tablet, Take 25 mcg by mouth daily before breakfast., Disp: , Rfl:  .  montelukast (SINGULAIR) 10 MG tablet, TAKE ONE TABLET BY MOUTH AT BEDTIME, Disp: 30 tablet, Rfl: 5 .  Multiple Vitamin (MULTIVITAMIN WITH MINERALS) TABS tablet, Take 1 tablet by mouth daily., Disp: , Rfl:   .  naproxen (NAPROSYN) 500 MG tablet, Take 1 tablet (500 mg total) by mouth 2 (two) times daily with a meal., Disp: 60 tablet, Rfl: 0 .  Omega-3 Fatty Acids (FISH OIL) 1200 MG CAPS, Take 1 capsule by mouth daily., Disp: , Rfl:  .  omeprazole (PRILOSEC) 40 MG capsule, TAKE 1 CAPSULE BY MOUTH EVERY DAY, Disp: 30 capsule, Rfl: 5 .  Probiotic Product (PROBIOTIC ADVANCED PO), Take 1 tablet by mouth daily., Disp: , Rfl:  .  Respiratory Therapy Supplies (FLUTTER) DEVI, Use daily as directed, Disp: 1 each, Rfl: 0 .  rosuvastatin (CRESTOR) 10 MG tablet, Take 10 mg by mouth daily., Disp: , Rfl:  .  traMADol (ULTRAM) 50  MG tablet, Take 1 tablet (50 mg total) by mouth every 6 (six) hours as needed (cough)., Disp: 45 tablet, Rfl: 1 .  triamcinolone (NASACORT ALLERGY 24HR) 55 MCG/ACT AERO nasal inhaler, Place 2 sprays into the nose daily., Disp: , Rfl:  .  verapamil (VERELAN PM) 240 MG 24 hr capsule, Take 240 mg by mouth 2 (two) times daily., Disp: , Rfl:  .  vitamin C (ASCORBIC ACID) 500 MG tablet, Take 500 mg by mouth daily., Disp: , Rfl:  .  azelastine (ASTELIN) 0.1 % nasal spray, Place 2 sprays into both nostrils 2 (two) times daily. Use in each nostril as directed (Patient not taking: Reported on 04/03/2017), Disp: 30 mL, Rfl: 12

## 2017-04-09 ENCOUNTER — Telehealth: Payer: Self-pay | Admitting: Pulmonary Disease

## 2017-04-09 NOTE — Telephone Encounter (Signed)
Spoke with Brett Canales. He stated that the RX that had been sent in for the gabapentin  was only enough for 7 days. Reviewed AVS, BQ wanted the pt to be on the medication for 30 days. Vonzell Schlatter that was ok to fill 270 tablets (enough for a month supply). He verbalized understanding. Nothing else needed at time of call.

## 2017-04-14 NOTE — Telephone Encounter (Signed)
I will advise, thank you!!  ATC patient at home number in EMR, spoke with patients mother and was directed to patients work number. ATC patient at work number [phone] no answer [id number]

## 2017-04-25 ENCOUNTER — Other Ambulatory Visit: Payer: Self-pay | Admitting: Pulmonary Disease

## 2017-05-01 ENCOUNTER — Ambulatory Visit (INDEPENDENT_AMBULATORY_CARE_PROVIDER_SITE_OTHER): Payer: BLUE CROSS/BLUE SHIELD | Admitting: Adult Health

## 2017-05-01 ENCOUNTER — Encounter: Payer: Self-pay | Admitting: Adult Health

## 2017-05-01 DIAGNOSIS — R059 Cough, unspecified: Secondary | ICD-10-CM

## 2017-05-01 DIAGNOSIS — R05 Cough: Secondary | ICD-10-CM | POA: Diagnosis not present

## 2017-05-01 MED ORDER — GABAPENTIN 300 MG PO CAPS: 900.0000 mg | ORAL_CAPSULE | Freq: Three times a day (TID) | ORAL | 0 refills | Status: DC

## 2017-05-01 NOTE — Patient Instructions (Addendum)
Continue on Gabapentin 300mg  3 Three times a day   Begin Pepcid 20mg  At bedtime   May use Chlortrimeton 4mg  At bedtime  As needed  Drainage.  Saline nasal As needed   Can use Robitussin DM 1-2 tsp every 4hr as needed for cough .  Follow up Dr. Kendrick FriesMcQuaid in 3 months and As needed   Please contact office for sooner follow up if symptom Hold oil based meds for now .

## 2017-05-01 NOTE — Progress Notes (Signed)
@Patient  ID: Jill Crawford, female    DOB: 1955/08/25, 61 y.o.   MRN: [id number]  No chief complaint on file.   Referring provider: Leim Fabry, MD  HPI: 61 yo female with minimal smoking hx followed chronic cough  And recurrent bronchitis    TEST  PFT: October 2016 pulmonary function testing ratio 90%, FEV1 2.43 L (93% predicted) this he is, total lung capacity 4.45 L (88% predicted), DLCO 23.92 (98% predicted). April 2017 pulmonary function testing ratio 88%, FVC 2.69 L, total lung capacity 4.83 L, DLCO 21.66 (89% predicted)  Chest imaging: April 2017 high-resolution CT scan. Findings previously worrisome for NSIP have now completely resolved. No evidence of underlying pulmonary parenchymal disease.  05/01/2017 Follow up : Chronic cough  Pt returns for 1 month follow up . She has been followed for refractory cough for last several years. Despite aggressive trigger prevention and cough control meds she continues to call. She has had some improvement with gabapentin . Last ov Gabapentin was increased to 900mg  Three times a day  . CXR last ov with BB atx. Says she is tolerating Gabapentin . We discussed cough control meds. And trigger control . Says she does feel some better. Cough is not as violent .   Allergies  Allergen Reactions  . Levaquin [Levofloxacin In D5w] Itching    itching  . Penicillins     Serum sickness  . Sulfa Antibiotics     Hives, itching, swelling  . Aspirin     Sensitive in large doses    Immunization History  Administered Date(s) Administered  . Influenza Split 03/18/2014, 04/10/2015, 05/03/2016  . Pneumococcal Polysaccharide-23 05/15/2004  . Zoster 04/10/2015    Past Medical History:  Diagnosis Date  . Hypercholesteremia   . Hypertension   . Seasonal allergies     Tobacco History: History  Smoking Status  . Never Smoker  Smokeless Tobacco  . Never Used    Comment: exposed to secondhand smoke growing up.    Counseling  given: Not Answered   Outpatient Encounter Prescriptions as of 05/01/2017  Medication Sig  . Acetylcarnitine HCl (ACETYL L-CARNITINE) 500 MG CAPS Take 1 capsule by mouth daily.  Marland Kitchen albuterol (PROVENTIL HFA;VENTOLIN HFA) 108 (90 BASE) MCG/ACT inhaler Inhale 2 puffs into the lungs every 6 (six) hours as needed for wheezing or shortness of breath.  . Alpha-Lipoic Acid 200 MG CAPS Take 1 capsule by mouth daily.  Marland Kitchen aspirin 81 MG tablet Take 81 mg by mouth daily.  Marland Kitchen azelastine (ASTELIN) 0.1 % nasal spray Place 2 sprays into both nostrils 2 (two) times daily. Use in each nostril as directed (Patient not taking: Reported on 04/03/2017)  . b complex vitamins capsule Take 1 capsule by mouth daily.  . Calcium Carbonate-Vitamin D (CALCIUM-VITAMIN D) 500-200 MG-UNIT per tablet Take 1 tablet by mouth 2 (two) times daily.  . cetirizine (ZYRTEC) 10 MG tablet Take 10 mg by mouth daily.  . chlorpheniramine (CHLOR-TRIMETON) 4 MG tablet Take 4 mg by mouth 2 (two) times daily as needed for allergies.  . Cholecalciferol (VITAMIN D3) 2000 UNITS TABS Take 1 tablet by mouth daily.  . Coenzyme Q10 (CO Q 10) 100 MG CAPS Take 1 capsule by mouth daily.  . Flaxseed, Linseed, (FLAXSEED OIL) 1000 MG CAPS Take 1 capsule by mouth daily.  Marland Kitchen gabapentin (NEURONTIN) 300 MG capsule Take 3 capsules (900 mg total) by mouth 3 (three) times daily.  . Garlic 1000 MG CAPS Take 1 capsule by mouth daily.  Marland Kitchen  Glucos-Chond-Hyal Ac-Ca Fructo (MOVE FREE JOINT HEALTH ADVANCE PO) Take 1 capsule by mouth daily.  . Grape Seed Extract 100 MG CAPS Take 1 capsule by mouth daily.  Marland Kitchen. L-Lysine 500 MG TABS Take 1 tablet by mouth daily.  Marland Kitchen. levothyroxine (SYNTHROID, LEVOTHROID) 25 MCG tablet Take 25 mcg by mouth daily before breakfast.  . montelukast (SINGULAIR) 10 MG tablet TAKE ONE TABLET BY MOUTH AT BEDTIME  . Multiple Vitamin (MULTIVITAMIN WITH MINERALS) TABS tablet Take 1 tablet by mouth daily.  . naproxen (NAPROSYN) 500 MG tablet Take 1 tablet (500 mg  total) by mouth 2 (two) times daily with a meal.  . Omega-3 Fatty Acids (FISH OIL) 1200 MG CAPS Take 1 capsule by mouth daily.  Marland Kitchen. omeprazole (PRILOSEC) 40 MG capsule TAKE 1 CAPSULE EVERY DAY  . Probiotic Product (PROBIOTIC ADVANCED PO) Take 1 tablet by mouth daily.  Marland Kitchen. Respiratory Therapy Supplies (FLUTTER) DEVI Use daily as directed  . rosuvastatin (CRESTOR) 10 MG tablet Take 10 mg by mouth daily.  . traMADol (ULTRAM) 50 MG tablet Take 1 tablet (50 mg total) by mouth every 6 (six) hours as needed (cough).  . triamcinolone (NASACORT ALLERGY 24HR) 55 MCG/ACT AERO nasal inhaler Place 2 sprays into the nose daily.  . verapamil (VERELAN PM) 240 MG 24 hr capsule Take 240 mg by mouth 2 (two) times daily.  . vitamin C (ASCORBIC ACID) 500 MG tablet Take 500 mg by mouth daily.   No facility-administered encounter medications on file as of 05/01/2017.      Review of Systems  Constitutional:   No  weight loss, night sweats,  Fevers, chills, fatigue, or  lassitude.  HEENT:   No headaches,  Difficulty swallowing,  Tooth/dental problems, or  Sore throat,                No sneezing, itching, ear ache, nasal congestion, post nasal drip,   CV:  No chest pain,  Orthopnea, PND, swelling in lower extremities, anasarca, dizziness, palpitations, syncope.   GI  No heartburn, indigestion, abdominal pain, nausea, vomiting, diarrhea, change in bowel habits, loss of appetite, bloody stools.   Resp: No shortness of breath with exertion or at rest.  No excess mucus, no productive cough,  No non-productive cough,  No coughing up of blood.  No change in color of mucus.  No wheezing.  No chest wall deformity  Skin: no rash or lesions.  GU: no dysuria, change in color of urine, no urgency or frequency.  No flank pain, no hematuria   MS:  No joint pain or swelling.  No decreased range of motion.  No back pain.    Physical Exam     GEN: A/Ox3; pleasant , NAD, well nourished , + cough    HEENT:  [place]/AT,   EACs-clear, TMs-wnl, NOSE-clear, THROAT-clear, no lesions, no postnasal drip or exudate noted.   NECK:  Supple w/ fair ROM; no JVD; normal carotid impulses w/o bruits; no thyromegaly or nodules palpated; no lymphadenopathy.    RESP  Clear  P & A; w/o, wheezes/ rales/ or rhonchi. no accessory muscle use, no dullness to percussion  CARD:  RRR, no m/r/g, no peripheral edema, pulses intact, no cyanosis or clubbing.  GI:   Soft & nt; nml bowel sounds; no organomegaly or masses detected.   Musco: Warm bil, no deformities or joint swelling noted.   Neuro: alert, no focal deficits noted.    Skin: Warm, no lesions or rashes    Lab Results:  CBC No results found for: WBC, RBC, HGB, HCT, PLT, MCV, MCH, MCHC, RDW, LYMPHSABS, MONOABS, EOSABS, BASOSABS  BMET No results found for: NA, K, CL, CO2, GLUCOSE, BUN, CREATININE, CALCIUM, GFRNONAA, GFRAA  BNP No results found for: BNP  ProBNP No results found for: PROBNP  Imaging: Dg Chest 2 View  Result Date: 04/04/2017 CLINICAL DATA:  Chronic cough for the past 3 years. History of pneumonia bronchitis. EXAM: CHEST  2 VIEW COMPARISON:  07/14/2015; 08/18/2014; chest CT - 11/07/2015 FINDINGS: Grossly unchanged cardiac silhouette and mediastinal contours. Bibasilar heterogeneous opacities, left greater than right, are unchanged and favored to represent atelectasis or scar. Grossly unchanged biapical pleuroparenchymal thickening. No new focal airspace opacities. No pleural effusion or pneumothorax. No evidence of edema. No acute osseus abnormalities. IMPRESSION: Bibasilar atelectasis / scar without superimposed acute cardiopulmonary disease. Electronically Signed   By: Simonne Come M.D.   On: 04/04/2017 09:32     Assessment & Plan:   No problem-specific Assessment & Plan notes found for this encounter.     Rubye Oaks, NP 05/01/2017

## 2017-05-01 NOTE — Progress Notes (Signed)
Reviewed, agree 

## 2017-05-02 NOTE — Assessment & Plan Note (Addendum)
Chronic cough - cont aggressive cough control regimen  Avoid oil based meds for now  Pt education on Gabapentin .  GERD control   Plan  . Patient Instructions  Continue on Gabapentin 300mg  3 Three times a day   Begin Pepcid 20mg  At bedtime   May use Chlortrimeton 4mg  At bedtime  As needed  Drainage.  Saline nasal As needed   Can use Robitussin DM 1-2 tsp every 4hr as needed for cough .  Follow up Dr. Kendrick FriesMcQuaid in 3 months and As needed   Please contact office for sooner follow up if symptom Hold oil based meds for now .

## 2017-06-11 ENCOUNTER — Other Ambulatory Visit: Payer: Self-pay | Admitting: Family Medicine

## 2017-06-11 DIAGNOSIS — Z1231 Encounter for screening mammogram for malignant neoplasm of breast: Secondary | ICD-10-CM

## 2017-07-02 ENCOUNTER — Ambulatory Visit
Admission: RE | Admit: 2017-07-02 | Discharge: 2017-07-02 | Disposition: A | Discharge status: Home / Self Care | Payer: BLUE CROSS/BLUE SHIELD | Source: Ambulatory Visit | Attending: Family Medicine | Admitting: Family Medicine

## 2017-07-02 DIAGNOSIS — Z1231 Encounter for screening mammogram for malignant neoplasm of breast: Secondary | ICD-10-CM | POA: Diagnosis present

## 2017-07-28 ENCOUNTER — Other Ambulatory Visit: Payer: Self-pay | Admitting: Adult Health

## 2017-07-28 ENCOUNTER — Other Ambulatory Visit: Payer: Self-pay | Admitting: Pulmonary Disease

## 2017-08-07 ENCOUNTER — Ambulatory Visit: Payer: Self-pay | Admitting: Pulmonary Disease

## 2017-08-08 ENCOUNTER — Ambulatory Visit (INDEPENDENT_AMBULATORY_CARE_PROVIDER_SITE_OTHER): Payer: BLUE CROSS/BLUE SHIELD | Admitting: Adult Health

## 2017-08-08 ENCOUNTER — Encounter: Payer: Self-pay | Admitting: Pulmonary Disease

## 2017-08-08 DIAGNOSIS — J309 Allergic rhinitis, unspecified: Secondary | ICD-10-CM

## 2017-08-08 DIAGNOSIS — R059 Cough, unspecified: Secondary | ICD-10-CM

## 2017-08-08 DIAGNOSIS — R05 Cough: Secondary | ICD-10-CM | POA: Diagnosis not present

## 2017-08-08 NOTE — Assessment & Plan Note (Signed)
Chronic cough - she is having improvement severity of cough / quality of life is improved on current regimen .  Has been on Gabapentin 900 Three times a day  For last 4 months , will try to lower dose slightly (start with daytime dose, keep night time dose the same ) .  Cont trigger control  Trying to decrease some of her daytime meds that contribute to her daytime sleepiness.    Plan  Patient Instructions  Decrease Gabapentin 300mg  2 capsules (600mg  ) morning and lunchtime dose  Continue on Gabapentin 300mg  3 capsules (900mg  ) At bedtime  .  Continue on Pepcid 20mg  At bedtime   Continue on Chlortrimeton 4mg  At bedtime  , may use extra during morning if needed.  Saline nasal spray and gel As needed   Use Nasacort and Astlein Nasal spray daily .  Can use Robitussin DM 1-2 tsp every 4hr as needed for cough .  Follow up Dr. Kendrick FriesMcQuaid in 2- 3 months and As needed

## 2017-08-08 NOTE — Patient Instructions (Addendum)
Decrease Gabapentin 300mg  2 capsules (600mg  ) morning and lunchtime dose  Continue on Gabapentin 300mg  3 capsules (900mg  ) At bedtime  .  Continue on Pepcid 20mg  At bedtime   Continue on Chlortrimeton 4mg  At bedtime  , may use extra during morning if needed.  Saline nasal spray and gel As needed   Use Nasacort and Astlein Nasal spray daily .  Can use Robitussin DM 1-2 tsp every 4hr as needed for cough .  Follow up Dr. Kendrick FriesMcQuaid in 2- 3 months and As needed

## 2017-08-08 NOTE — Progress Notes (Signed)
Subjective:    Patient ID: Jill Crawford, female    DOB: Oct 30, 1955, 62 y.o.   MRN: [id number]    Synopsis: Jill Crawford was referred to the [facility] pulmonary clinic in 2016 for evaluation of cough and dyspnea.  She had a history of recurrent bronchitis and a very minimal smoking history (less than one week in college).  PFT in 2016 was normal, CXR showed bronchitis.  08/2014 CT chest ([place] read)> patchy areas of peribronchovascular thickening and ggo worrisome for NSIP, doesn't look like UIP but there is a strong cranio-caudal gradient, mild bronchiectasis     HPI No chief complaint on file.    Past Medical History:  Diagnosis Date  . Hypercholesteremia   . Hypertension   . Seasonal allergies      Review of Systems  Constitutional: Positive for fatigue. Negative for chills and fever.  HENT: Negative for rhinorrhea, sinus pressure and sneezing.   Respiratory: Positive for cough. Negative for shortness of breath and wheezing.   Cardiovascular: Negative for chest pain, palpitations and leg swelling.       Objective:   Physical Exam  There were no vitals filed for this visit.RA     PFT: October 2016 pulmonary function testing ratio 90%, FEV1 2.43 L (93% predicted) this he is, total lung capacity 4.45 L (88% predicted), DLCO 23.92 (98% predicted). April 2017 pulmonary function testing ratio 88%, FVC 2.69 L, total lung capacity 4.83 L, DLCO 21.66 (89% predicted)  Chest imaging: April 2017 high-resolution CT scan. Findings previously worrisome for NSIP have now completely resolved. No evidence of underlying pulmonary parenchymal disease.      Assessment & Plan:   No diagnosis found.    Current Outpatient Medications:  .  Acetylcarnitine HCl (ACETYL L-CARNITINE) 500 MG CAPS, Take 1 capsule by mouth daily., Disp: , Rfl:  .  albuterol (PROVENTIL HFA;VENTOLIN HFA) 108 (90 BASE) MCG/ACT inhaler, Inhale 2 puffs into the lungs every 6 (six) hours as  needed for wheezing or shortness of breath., Disp: , Rfl:  .  Alpha-Lipoic Acid 200 MG CAPS, Take 1 capsule by mouth daily., Disp: , Rfl:  .  aspirin 81 MG tablet, Take 81 mg by mouth daily., Disp: , Rfl:  .  b complex vitamins capsule, Take 1 capsule by mouth daily., Disp: , Rfl:  .  Calcium Carbonate-Vitamin D (CALCIUM-VITAMIN D) 500-200 MG-UNIT per tablet, Take 1 tablet by mouth 2 (two) times daily., Disp: , Rfl:  .  cetirizine (ZYRTEC) 10 MG tablet, Take 10 mg by mouth daily., Disp: , Rfl:  .  chlorpheniramine (CHLOR-TRIMETON) 4 MG tablet, Take 4 mg by mouth 2 (two) times daily as needed for allergies., Disp: , Rfl:  .  Cholecalciferol (VITAMIN D3) 2000 UNITS TABS, Take 1 tablet by mouth daily., Disp: , Rfl:  .  Coenzyme Q10 (CO Q 10) 100 MG CAPS, Take 1 capsule by mouth daily., Disp: , Rfl:  .  Flaxseed, Linseed, (FLAXSEED OIL) 1000 MG CAPS, Take 1 capsule by mouth daily., Disp: , Rfl:  .  gabapentin (NEURONTIN) 300 MG capsule, TAKE 3 CAPSULES BY MOUTH 3 TIMES DAILY, Disp: 810 capsule, Rfl: 0 .  Garlic 1000 MG CAPS, Take 1 capsule by mouth daily., Disp: , Rfl:  .  Glucos-Chond-Hyal Ac-Ca Fructo (MOVE FREE JOINT HEALTH ADVANCE PO), Take 1 capsule by mouth daily., Disp: , Rfl:  .  Grape Seed Extract 100 MG CAPS, Take 1 capsule by mouth daily., Disp: , Rfl:  .  L-Lysine  500 MG TABS, Take 1 tablet by mouth daily., Disp: , Rfl:  .  levothyroxine (SYNTHROID, LEVOTHROID) 25 MCG tablet, Take 25 mcg by mouth daily before breakfast., Disp: , Rfl:  .  montelukast (SINGULAIR) 10 MG tablet, TAKE ONE TABLET AT BEDTIME, Disp: 30 tablet, Rfl: 5 .  Multiple Vitamin (MULTIVITAMIN WITH MINERALS) TABS tablet, Take 1 tablet by mouth daily., Disp: , Rfl:  .  naproxen (NAPROSYN) 500 MG tablet, Take 1 tablet (500 mg total) by mouth 2 (two) times daily with a meal., Disp: 60 tablet, Rfl: 0 .  Omega-3 Fatty Acids (FISH OIL) 1200 MG CAPS, Take 1 capsule by mouth daily., Disp: , Rfl:  .  omeprazole (PRILOSEC) 40 MG  capsule, TAKE 1 CAPSULE EVERY DAY, Disp: 30 capsule, Rfl: 5 .  Respiratory Therapy Supplies (FLUTTER) DEVI, Use daily as directed, Disp: 1 each, Rfl: 0 .  rosuvastatin (CRESTOR) 10 MG tablet, Take 10 mg by mouth daily., Disp: , Rfl:  .  traMADol (ULTRAM) 50 MG tablet, Take 1 tablet (50 mg total) by mouth every 6 (six) hours as needed (cough)., Disp: 45 tablet, Rfl: 1 .  triamcinolone (NASACORT ALLERGY 24HR) 55 MCG/ACT AERO nasal inhaler, Place 2 sprays into the nose daily., Disp: , Rfl:  .  verapamil (VERELAN PM) 240 MG 24 hr capsule, Take 240 mg by mouth 2 (two) times daily., Disp: , Rfl:  .  vitamin C (ASCORBIC ACID) 500 MG tablet, Take 500 mg by mouth daily., Disp: , Rfl:

## 2017-08-08 NOTE — Progress Notes (Signed)
@Patient  ID: Jill Crawford, female    DOB: [birth year], 62 y.o.   MRN: [id number]  Chief Complaint  Patient presents with  . Follow-up    pt states she is doing well at this time.     Referring provider: Leim Fabry, MD  HPI:  Jill Crawford was referred to the [facility] pulmonary clinic in 2016 for evaluation of cough and dyspnea.  She had a history of recurrent bronchitis and a very minimal smoking history (less than one week in college).  PFT in 2016 was normal, CXR showed bronchitis.  08/2014 CT chest ([place] read)> patchy areas of peribronchovascular thickening and ggo worrisome for NSIP, doesn't look like UIP but there is a strong cranio-caudal gradient, mild bronchiectasis  TEST   PFT: October 2016 pulmonary function testing ratio 90%, FEV1 2.43 L (93% predicted) this he is, total lung capacity 4.45 L (88% predicted), DLCO 23.92 (98% predicted). April 2017 pulmonary function testing ratio 88%, FVC 2.69 L, total lung capacity 4.83 L, DLCO 21.66 (89% predicted)  Chest imaging: HRCT Chest 10/2015 >significant improvement  Active inflammation resolved.   08/08/2017 Follow up :Chronic cough  Patient presents for a 52-month follow-up patient is followed for a chronic cough.  There has been going on for several years.  She has had a severe refractory cough that has been very difficult to control. She is currently on an aggressive cough suppression regimen along with trigger prevention.  She says that her cough is substantially improved from where it was originally.  She is able to go to activities work in social events without issue.  She is currently on gabapentin 900 mg 3 times daily.  She is using Chlor-Trimeton twice daily along with Astelin and Nasacort for postnasal drip symptoms.  She remains on Singulair and Zyrtec daily.  She is on Prilosec and Pepcid  She denies any chest pain orthopnea PND or leg swelling.  Uses Robitussin  DM for cough control Says she  she has some daytime sleepiness, mainly after lunchtime .     Allergies  Allergen Reactions  . Levaquin [Levofloxacin In D5w] Itching    itching  . Penicillins     Serum sickness  . Sulfa Antibiotics     Hives, itching, swelling  . Aspirin     Sensitive in large doses    Immunization History  Administered Date(s) Administered  . Influenza Split 03/18/2014, 04/10/2015, 05/03/2016, 04/15/2017  . Pneumococcal Polysaccharide-23 05/15/2004  . Zoster 04/10/2015    Past Medical History:  Diagnosis Date  . Hypercholesteremia   . Hypertension   . Seasonal allergies     Tobacco History: Social History   Tobacco Use  Smoking Status Never Smoker  Smokeless Tobacco Never Used  Tobacco Comment   exposed to secondhand smoke growing up.    Counseling given: Not Answered Comment: exposed to secondhand smoke growing up.    Outpatient Encounter Medications as of 08/08/2017  Medication Sig  . Acetylcarnitine HCl (ACETYL L-CARNITINE) 500 MG CAPS Take 1 capsule by mouth daily.  Marland Kitchen albuterol (PROVENTIL HFA;VENTOLIN HFA) 108 (90 BASE) MCG/ACT inhaler Inhale 2 puffs into the lungs every 6 (six) hours as needed for wheezing or shortness of breath.  . Alpha-Lipoic Acid 200 MG CAPS Take 1 capsule by mouth daily.  Marland Kitchen aspirin 81 MG tablet Take 81 mg by mouth daily.  Marland Kitchen azelastine (ASTELIN) 0.1 % nasal spray Place 2 sprays into both nostrils 2 (two) times daily. Use in each nostril as directed  .  b complex vitamins capsule Take 1 capsule by mouth daily.  . Calcium Carbonate-Vitamin D (CALCIUM-VITAMIN D) 500-200 MG-UNIT per tablet Take 1 tablet by mouth 2 (two) times daily.  . cetirizine (ZYRTEC) 10 MG tablet Take 10 mg by mouth daily.  . chlorpheniramine (CHLOR-TRIMETON) 4 MG tablet Take 4 mg by mouth 2 (two) times daily as needed for allergies.  . Cholecalciferol (VITAMIN D3) 2000 UNITS TABS Take 1 tablet by mouth daily.  . Coenzyme Q10 (CO Q 10) 100 MG CAPS Take 1 capsule by mouth daily.  .  Flaxseed, Linseed, (FLAXSEED OIL) 1000 MG CAPS Take 1 capsule by mouth daily.  Marland Kitchen gabapentin (NEURONTIN) 300 MG capsule TAKE 3 CAPSULES BY MOUTH 3 TIMES DAILY  . Garlic 1000 MG CAPS Take 1 capsule by mouth daily.  . Glucos-Chond-Hyal Ac-Ca Fructo (MOVE FREE JOINT HEALTH ADVANCE PO) Take 1 capsule by mouth daily.  . Grape Seed Extract 100 MG CAPS Take 1 capsule by mouth daily.  Marland Kitchen L-Lysine 500 MG TABS Take 1 tablet by mouth daily.  Marland Kitchen levothyroxine (SYNTHROID, LEVOTHROID) 25 MCG tablet Take 25 mcg by mouth daily before breakfast.  . montelukast (SINGULAIR) 10 MG tablet TAKE ONE TABLET AT BEDTIME  . Multiple Vitamin (MULTIVITAMIN WITH MINERALS) TABS tablet Take 1 tablet by mouth daily.  . Omega-3 Fatty Acids (FISH OIL) 1200 MG CAPS Take 1 capsule by mouth daily.  Marland Kitchen omeprazole (PRILOSEC) 40 MG capsule TAKE 1 CAPSULE EVERY DAY  . Respiratory Therapy Supplies (FLUTTER) DEVI Use daily as directed  . rosuvastatin (CRESTOR) 10 MG tablet Take 10 mg by mouth daily.  . traMADol (ULTRAM) 50 MG tablet Take 1 tablet (50 mg total) by mouth every 6 (six) hours as needed (cough).  . triamcinolone (NASACORT ALLERGY 24HR) 55 MCG/ACT AERO nasal inhaler Place 2 sprays into the nose daily.  . verapamil (VERELAN PM) 240 MG 24 hr capsule Take 240 mg by mouth 2 (two) times daily.  . vitamin C (ASCORBIC ACID) 500 MG tablet Take 500 mg by mouth daily.  . [DISCONTINUED] naproxen (NAPROSYN) 500 MG tablet Take 1 tablet (500 mg total) by mouth 2 (two) times daily with a meal. (Patient not taking: Reported on 08/08/2017)   No facility-administered encounter medications on file as of 08/08/2017.      Review of Systems  Constitutional:   No  weight loss, night sweats,  Fevers, chills, fatigue, or  lassitude.  HEENT:   No headaches,  Difficulty swallowing,  Tooth/dental problems, or  Sore throat,                No sneezing, itching, ear ache,  +nasal congestion, post nasal drip,   CV:  No chest pain,  Orthopnea, PND,  swelling in lower extremities, anasarca, dizziness, palpitations, syncope.   GI  No heartburn, indigestion, abdominal pain, nausea, vomiting, diarrhea, change in bowel habits, loss of appetite, bloody stools.   Resp: No chest wall deformity  Skin: no rash or lesions.  GU: no dysuria, change in color of urine, no urgency or frequency.  No flank pain, no hematuria   MS:  No joint pain or swelling.  No decreased range of motion.  No back pain.    Physical Exam  BP 134/72 (BP Location: Right Arm, Cuff Size: Normal)   Pulse 79   Ht 5\' 5"  (1.651 m)   Wt 174 lb (78.9 kg)   SpO2 97%   BMI 28.96 kg/m   GEN: A/Ox3; pleasant , NAD, well nourished   HEENT:  [place]/AT,  EACs-clear, TMs-wnl, NOSE-clear, THROAT-clear, no lesions, no postnasal drip or exudate noted.   NECK:  Supple w/ fair ROM; no JVD; normal carotid impulses w/o bruits; no thyromegaly or nodules palpated; no lymphadenopathy.    RESP  Clear  P & A; w/o, wheezes/ rales/ or rhonchi. no accessory muscle use, no dullness to percussion  CARD:  RRR, no m/r/g, no peripheral edema, pulses intact, no cyanosis or clubbing.  GI:   Soft & nt; nml bowel sounds; no organomegaly or masses detected.   Musco: Warm bil, no deformities or joint swelling noted.   Neuro: alert, no focal deficits noted.    Skin: Warm, no lesions or rashes    Lab Results:  CBC No results found for: WBC, RBC, HGB, HCT, PLT, MCV, MCH, MCHC, RDW, LYMPHSABS, MONOABS, EOSABS, BASOSABS  BMET No results found for: NA, K, CL, CO2, GLUCOSE, BUN, CREATININE, CALCIUM, GFRNONAA, GFRAA  BNP No results found for: BNP  ProBNP No results found for: PROBNP  Imaging: No results found.   Assessment & Plan:   Cough Chronic cough - she is having improvement severity of cough / quality of life is improved on current regimen .  Has been on Gabapentin 900 Three times a day  For last 4 months , will try to lower dose slightly (start with daytime dose, keep night time  dose the same ) .  Cont trigger control  Trying to decrease some of her daytime meds that contribute to her daytime sleepiness.    Plan  Patient Instructions  Decrease Gabapentin 300mg  2 capsules (600mg  ) morning and lunchtime dose  Continue on Gabapentin 300mg  3 capsules (900mg  ) At bedtime  .  Continue on Pepcid 20mg  At bedtime   Continue on Chlortrimeton 4mg  At bedtime  , may use extra during morning if needed.  Saline nasal spray and gel As needed   Use Nasacort and Astlein Nasal spray daily .  Can use Robitussin DM 1-2 tsp every 4hr as needed for cough .  Follow up Dr. Kendrick FriesMcQuaid in 2- 3 months and As needed          Allergic rhinitis Cont on aggressive regimen       Rubye Oaksammy Parrett, NP 08/08/2017

## 2017-08-08 NOTE — Assessment & Plan Note (Signed)
Cont on aggressive regimen

## 2017-08-25 ENCOUNTER — Other Ambulatory Visit: Payer: Self-pay | Admitting: Adult Health

## 2017-09-22 ENCOUNTER — Other Ambulatory Visit: Payer: Self-pay | Admitting: Pulmonary Disease

## 2017-10-08 ENCOUNTER — Ambulatory Visit (INDEPENDENT_AMBULATORY_CARE_PROVIDER_SITE_OTHER): Payer: BLUE CROSS/BLUE SHIELD | Admitting: Pulmonary Disease

## 2017-10-08 ENCOUNTER — Encounter: Payer: Self-pay | Admitting: Pulmonary Disease

## 2017-10-08 VITALS — BP 126/78 | HR 86 | Ht 65.0 in | Wt 172.0 lb

## 2017-10-08 DIAGNOSIS — R05 Cough: Secondary | ICD-10-CM

## 2017-10-08 DIAGNOSIS — R059 Cough, unspecified: Secondary | ICD-10-CM

## 2017-10-08 DIAGNOSIS — J209 Acute bronchitis, unspecified: Secondary | ICD-10-CM | POA: Diagnosis not present

## 2017-10-08 DIAGNOSIS — J309 Allergic rhinitis, unspecified: Secondary | ICD-10-CM | POA: Diagnosis not present

## 2017-10-08 MED ORDER — DOXYCYCLINE HYCLATE 100 MG PO TABS: 100.0000 mg | ORAL_TABLET | Freq: Two times a day (BID) | ORAL | 0 refills | Status: DC

## 2017-10-08 MED ORDER — AZELASTINE HCL 0.1 % NA SOLN: 2.0000 | Freq: Two times a day (BID) | NASAL | 5 refills | Status: DC

## 2017-10-08 MED ORDER — ALBUTEROL SULFATE HFA 108 (90 BASE) MCG/ACT IN AERS: 2.0000 | INHALATION_SPRAY | Freq: Four times a day (QID) | RESPIRATORY_TRACT | 5 refills | Status: DC | PRN

## 2017-10-08 MED ORDER — HYDROCOD POLST-CPM POLST ER 10-8 MG/5ML PO SUER: 5.0000 mL | Freq: Two times a day (BID) | ORAL | 0 refills | Status: DC | PRN

## 2017-10-08 NOTE — Patient Instructions (Signed)
Allergic rhinitis: Continue taking montelukast, Zyrtec, Nasonex, and Astelin  Gastroesophageal reflux disease: Continue taking omeprazole and Pepcid  Acute bronchitis: Take doxycycline 100 mg twice a day times 5 days  Chronic cough: Continue gabapentin as you are doing 600 mg 3 times a day Do not take tramadol over the next few days Take Tussionex twice a day as needed to help suppress the cough Voice rest is really important, so make sure you use warm beverages and hard candies to help suppress her cough and soothe your throat.

## 2017-10-08 NOTE — Addendum Note (Signed)
Addended by: Velvet BatheAULFIELD, ASHLEY L on: 10/08/2017 09:36 AM   Modules accepted: Orders

## 2017-10-08 NOTE — Progress Notes (Signed)
Subjective:    Patient ID: Jill Crawford, female    DOB: 01/24/1956, 62 y.o.   MRN: [id number]    Synopsis: Jill Crawford was referred to the [facility] pulmonary clinic in 2016 for evaluation of cough and dyspnea.  She had a history of recurrent bronchitis and a very minimal smoking history (less than one week in college).  PFT in 2016 was normal, CXR showed bronchitis.  08/2014 CT chest ([place] read)> patchy areas of peribronchovascular thickening and ggo worrisome for NSIP, doesn't look like UIP but there is a strong cranio-caudal gradient, mild bronchiectasis.  It is felt that her cough is due to severe laryngeal irritation and not an underlying lung disease.     HPI Chief Complaint  Patient presents with  . Follow-up    productive Cough yellow in color, SOB with cough and activity.     Jill Crawford got a cold about two weeks ago and she has started coughing.  No fever or chills.  She has some dyspnea when she is coughing.  She was able to push mow her yard three days ago.  She will produce some yellow mucus.    She is having sinus congestion and post nasal drip: > she is taking chlortrimeton, astelin, and nasacort, and singulair  GERD: well controlled > omeprazole in the morning, pepcid at night       Past Medical History:  Diagnosis Date  . Hypercholesteremia   . Hypertension   . Seasonal allergies      Review of Systems  Constitutional: Positive for fatigue. Negative for chills and fever.  HENT: Negative for rhinorrhea, sinus pressure and sneezing.   Respiratory: Positive for cough. Negative for shortness of breath and wheezing.   Cardiovascular: Negative for chest pain, palpitations and leg swelling.       Objective:   Physical Exam  Vitals:   10/08/17 0906  BP: 126/78  Pulse: 86  SpO2: 92%  Weight: 172 lb (78 kg)  Height: 5\' 5"  (1.651 m)  RA  Gen: well appearing HENT: OP clear, TM's clear, neck supple PULM: few crackles L base B, normal  percussion CV: RRR, no mgr, trace edema GI: BS+, soft, nontender Derm: no cyanosis or rash Psyche: normal mood and affect    PFT: October 2016 pulmonary function testing ratio 90%, FEV1 2.43 L (93% predicted) this he is, total lung capacity 4.45 L (88% predicted), DLCO 23.92 (98% predicted). April 2017 pulmonary function testing ratio 88%, FVC 2.69 L, total lung capacity 4.83 L, DLCO 21.66 (89% predicted)  Chest imaging: April 2017 high-resolution CT scan. Findings previously worrisome for NSIP have now completely resolved. No evidence of underlying pulmonary parenchymal disease.      Assessment & Plan:   Cough  Allergic rhinitis, unspecified seasonality, unspecified trigger  Acute bronchitis, unspecified organism  Discussion: From a chronic cough perspective Jill Crawford did well over the last several months until she caught a cold approximately 2 weeks ago.  She is now producing yellow mucus on a daily basis and has signs and symptoms of bacterial bronchitis.  As stated previously I am not convinced that there is an underlying lung disease causing this, it is mostly recurrent laryngeal irritation with an exaggerated cough response.  I have encouraged her to work on voice rest and suppressing the cough over the next few days while we give her stronger cough treatment with Tussionex and treatment for acute bronchitis with doxycycline.  If her symptoms do not improve then she may need  to have a higher dose of gabapentin.  Plan: Allergic rhinitis: Continue taking montelukast, Zyrtec, Nasonex, and Astelin  Gastroesophageal reflux disease: Continue taking omeprazole and Pepcid  Acute bronchitis: Take doxycycline 100 mg twice a day times 5 days  Chronic cough: Continue gabapentin as you are doing 600 mg 3 times a day Do not take tramadol over the next few days Take Tussionex twice a day as needed to help suppress the cough Voice rest is really important, so make sure you use warm  beverages and hard candies to help suppress her cough and soothe your throat.  We will see you back in 3 months or sooner if needed    Current Outpatient Medications:  .  Acetylcarnitine HCl (ACETYL L-CARNITINE) 500 MG CAPS, Take 1 capsule by mouth daily., Disp: , Rfl:  .  Alpha-Lipoic Acid 200 MG CAPS, Take 1 capsule by mouth daily., Disp: , Rfl:  .  aspirin 81 MG tablet, Take 81 mg by mouth daily., Disp: , Rfl:  .  azelastine (ASTELIN) 0.1 % nasal spray, Place 2 sprays into both nostrils 2 (two) times daily. Use in each nostril as directed, Disp: , Rfl:  .  b complex vitamins capsule, Take 1 capsule by mouth daily., Disp: , Rfl:  .  Calcium Carbonate-Vitamin D (CALCIUM-VITAMIN D) 500-200 MG-UNIT per tablet, Take 1 tablet by mouth 2 (two) times daily., Disp: , Rfl:  .  cetirizine (ZYRTEC) 10 MG tablet, Take 10 mg by mouth daily., Disp: , Rfl:  .  chlorpheniramine (CHLOR-TRIMETON) 4 MG tablet, Take 4 mg by mouth 2 (two) times daily as needed for allergies., Disp: , Rfl:  .  Cholecalciferol (VITAMIN D3) 2000 UNITS TABS, Take 1 tablet by mouth daily., Disp: , Rfl:  .  Coenzyme Q10 (CO Q 10) 100 MG CAPS, Take 1 capsule by mouth daily., Disp: , Rfl:  .  Flaxseed, Linseed, (FLAXSEED OIL) 1000 MG CAPS, Take 1 capsule by mouth daily., Disp: , Rfl:  .  gabapentin (NEURONTIN) 300 MG capsule, TAKE 3 CAPSULES BY MOUTH 3 TIMES DAILY, Disp: 810 capsule, Rfl: 0 .  Garlic 1000 MG CAPS, Take 1 capsule by mouth daily., Disp: , Rfl:  .  Glucos-Chond-Hyal Ac-Ca Fructo (MOVE FREE JOINT HEALTH ADVANCE PO), Take 1 capsule by mouth daily., Disp: , Rfl:  .  Grape Seed Extract 100 MG CAPS, Take 1 capsule by mouth daily., Disp: , Rfl:  .  L-Lysine 500 MG TABS, Take 1 tablet by mouth daily., Disp: , Rfl:  .  levothyroxine (SYNTHROID, LEVOTHROID) 25 MCG tablet, Take 25 mcg by mouth daily before breakfast., Disp: , Rfl:  .  montelukast (SINGULAIR) 10 MG tablet, TAKE ONE TABLET AT BEDTIME, Disp: 30 tablet, Rfl: 5 .   Multiple Vitamin (MULTIVITAMIN WITH MINERALS) TABS tablet, Take 1 tablet by mouth daily., Disp: , Rfl:  .  Omega-3 Fatty Acids (FISH OIL) 1200 MG CAPS, Take 1 capsule by mouth daily., Disp: , Rfl:  .  omeprazole (PRILOSEC) 40 MG capsule, TAKE 1 CAPSULE EVERY DAY, Disp: 30 capsule, Rfl: 5 .  Respiratory Therapy Supplies (FLUTTER) DEVI, Use daily as directed, Disp: 1 each, Rfl: 0 .  rosuvastatin (CRESTOR) 10 MG tablet, Take 10 mg by mouth daily., Disp: , Rfl:  .  traMADol (ULTRAM) 50 MG tablet, Take 1 tablet (50 mg total) by mouth every 6 (six) hours as needed (cough)., Disp: 45 tablet, Rfl: 1 .  triamcinolone (NASACORT ALLERGY 24HR) 55 MCG/ACT AERO nasal inhaler, Place 2 sprays into the  nose daily., Disp: , Rfl:  .  verapamil (VERELAN PM) 240 MG 24 hr capsule, Take 240 mg by mouth 2 (two) times daily., Disp: , Rfl:  .  vitamin C (ASCORBIC ACID) 500 MG tablet, Take 500 mg by mouth daily., Disp: , Rfl:  .  albuterol (PROVENTIL HFA;VENTOLIN HFA) 108 (90 BASE) MCG/ACT inhaler, Inhale 2 puffs into the lungs every 6 (six) hours as needed for wheezing or shortness of breath., Disp: , Rfl:  .  chlorpheniramine-HYDROcodone (TUSSIONEX PENNKINETIC ER) 10-8 MG/5ML SUER, Take 5 mLs by mouth every 12 (twelve) hours as needed for cough., Disp: 140 mL, Rfl: 0 .  doxycycline (VIBRA-TABS) 100 MG tablet, Take 1 tablet (100 mg total) by mouth 2 (two) times daily., Disp: 10 tablet, Rfl: 0

## 2017-12-22 ENCOUNTER — Other Ambulatory Visit: Payer: Self-pay | Admitting: Adult Health

## 2018-01-07 ENCOUNTER — Ambulatory Visit (INDEPENDENT_AMBULATORY_CARE_PROVIDER_SITE_OTHER): Payer: 59 | Admitting: Pulmonary Disease

## 2018-01-07 ENCOUNTER — Encounter: Payer: Self-pay | Admitting: Pulmonary Disease

## 2018-01-07 ENCOUNTER — Other Ambulatory Visit: Payer: Self-pay | Admitting: Internal Medicine

## 2018-01-07 VITALS — BP 120/82 | HR 87 | Ht 65.0 in | Wt 173.0 lb

## 2018-01-07 DIAGNOSIS — R05 Cough: Secondary | ICD-10-CM

## 2018-01-07 DIAGNOSIS — K219 Gastro-esophageal reflux disease without esophagitis: Secondary | ICD-10-CM

## 2018-01-07 DIAGNOSIS — J309 Allergic rhinitis, unspecified: Secondary | ICD-10-CM | POA: Diagnosis not present

## 2018-01-07 DIAGNOSIS — R059 Cough, unspecified: Secondary | ICD-10-CM

## 2018-01-07 NOTE — Addendum Note (Signed)
Addended by: Tana FeltsILLMAN, Ikey Omary J on: 01/07/2018 02:55 PM   Modules accepted: Orders

## 2018-01-07 NOTE — Progress Notes (Signed)
Subjective:    Patient ID: Jill Crawford, female    DOB: 12/21/1955, 62 y.o.   MRN: [id number]    Synopsis: Jill Crawford was referred to the [facility] pulmonary clinic in 2016 for evaluation of cough and dyspnea.  She had a history of recurrent bronchitis and a very minimal smoking history (less than one week in college).  PFT in 2016 was normal, CXR showed bronchitis.  08/2014 CT chest ([place] read)> patchy areas of peribronchovascular thickening and ggo worrisome for NSIP, doesn't look like UIP but there is a strong cranio-caudal gradient, mild bronchiectasis.  It is felt that her cough is due to severe laryngeal irritation and not an underlying lung disease.     HPI Chief Complaint  Patient presents with  . Follow-up    ROV, continues to have cough   Aerie says that after the last visit her mucus production cleared up right away and she started to feel better.  She says that she has not had problems with shortness of breath lately and she has been very active.  Specifically she says that she has been coughing some throughout the day if she has food or water go down the wrong pipe.  She says this happens a few times per week.  However, in general the severity of her cough has improved significantly since she has been taking the gabapentin.  She still will have severe coughing episodes from time to time but is not too frequently.  Usually she just has problems with cough when she has a cold.   Past Medical History:  Diagnosis Date  . Hypercholesteremia   . Hypertension   . Seasonal allergies      Review of Systems  Constitutional: Positive for fatigue. Negative for chills and fever.  HENT: Negative for rhinorrhea, sinus pressure and sneezing.   Respiratory: Positive for cough. Negative for shortness of breath and wheezing.   Cardiovascular: Negative for chest pain, palpitations and leg swelling.       Objective:   Physical Exam  Vitals:   01/07/18 1158  BP:  120/82  Pulse: 87  SpO2: 92%  Weight: 173 lb (78.5 kg)  Height: 5\' 5"  (1.651 m)  RA  Gen: well appearing HENT: OP clear, TM's clear, neck supple PULM: CTA B, normal percussion CV: RRR, no mgr, trace edema GI: BS+, soft, nontender Derm: no cyanosis or rash Psyche: normal mood and affect    PFT: October 2016 pulmonary function testing ratio 90%, FEV1 2.43 L (93% predicted) this he is, total lung capacity 4.45 L (88% predicted), DLCO 23.92 (98% predicted). April 2017 pulmonary function testing ratio 88%, FVC 2.69 L, total lung capacity 4.83 L, DLCO 21.66 (89% predicted)  Chest imaging: April 2017 high-resolution CT scan. Findings previously worrisome for NSIP have now completely resolved. No evidence of underlying pulmonary parenchymal disease.      Assessment & Plan:   Cough - Plan: SLP modified barium swallow  Allergic rhinitis, unspecified seasonality, unspecified trigger  Gastroesophageal reflux disease without esophagitis  Discussion: This has been a stable interval for [name].  Her symptoms have improved significantly since the most recent episode of bronchitis.  However, she continues to struggle with cough particularly when swallowing.  She notes that it is not uncommon for her to have food go down the wrong pipe and this will make her feel worse.  Plan: Trouble swallowing/food "going down the wrong pipe": We will arrange for a modified barium swallow test at [facility]  Gastroesophageal  reflux disease: Continue taking omeprazole and Pepcid as you are doing  Chronic cough: Continue taking gabapentin 600 mg 3 times a day If you have problems with a cough or cold please let us know right away  We will see you back in 6 months or sooner if needed   Current Outpatient Medications:  .  Acetylcarnitine HCl (ACETYL L-CARNITINE) 500 MG CAPS, Take 1 capsule by mouth daily., Disp: , Rfl:  .  albuterol (PROVENTIL HFA;VENTOLIN HFA) 108 (90 Base) MCG/ACT inhaler, Inhale 2  puffs into the lungs every 6 (six) hours as needed for wheezing or shortness of breath., Disp: 1 Inhaler, Rfl: 5 .  Alpha-Lipoic Acid 200 MG CAPS, Take 1 capsule by mouth daily., Disp: , Rfl:  .  azelastine (ASTELIN) 0.1 % nasal spray, Place 2 sprays into both nostrils 2 (two) times daily. Use in each nostril as directed, Disp: 30 mL, Rfl: 5 .  b complex vitamins capsule, Take 1 capsule by mouth daily., Disp: , Rfl:  .  Calcium Carbonate-Vitamin D (CALCIUM-VITAMIN D) 500-200 MG-UNIT per tablet, Take 1 tablet by mouth 2 (two) times daily., Disp: , Rfl:  .  cetirizine (ZYRTEC) 10 MG tablet, Take 10 mg by mouth daily., Disp: , Rfl:  .  chlorpheniramine (CHLOR-TRIMETON) 4 MG tablet, Take 4 mg by mouth 2 (two) times daily as needed for allergies., Disp: , Rfl:  .  chlorpheniramine-HYDROcodone (TUSSIONEX PENNKINETIC ER) 10-8 MG/5ML SUER, Take 5 mLs by mouth every 12 (twelve) hours as needed for cough., Disp: 140 mL, Rfl: 0 .  Cholecalciferol (VITAMIN D3) 2000 UNITS TABS, Take 1 tablet by mouth daily., Disp: , Rfl:  .  Coenzyme Q10 (CO Q 10) 100 MG CAPS, Take 1 capsule by mouth daily., Disp: , Rfl:  .  Flaxseed, Linseed, (FLAXSEED OIL) 1000 MG CAPS, Take 1 capsule by mouth daily., Disp: , Rfl:  .  gabapentin (NEURONTIN) 300 MG capsule, TAKE 3 CAPSULES 3 TIMES DAILY, Disp: 279 capsule, Rfl: 0 .  Garlic 1000 MG CAPS, Take 1 capsule by mouth daily., Disp: , Rfl:  .  Glucos-Chond-Hyal Ac-Ca Fructo (MOVE FREE JOINT HEALTH ADVANCE PO), Take 1 capsule by mouth daily., Disp: , Rfl:  .  Grape Seed Extract 100 MG CAPS, Take 1 capsule by mouth daily., Disp: , Rfl:  .  L-Lysine 500 MG TABS, Take 1 tablet by mouth daily., Disp: , Rfl:  .  levothyroxine (SYNTHROID, LEVOTHROID) 25 MCG tablet, Take 25 mcg by mouth daily before breakfast., Disp: , Rfl:  .  montelukast (SINGULAIR) 10 MG tablet, TAKE ONE TABLET AT BEDTIME, Disp: 30 tablet, Rfl: 5 .  Multiple Vitamin (MULTIVITAMIN WITH MINERALS) TABS tablet, Take 1 tablet by  mouth daily., Disp: , Rfl:  .  Omega-3 Fatty Acids (FISH OIL) 1200 MG CAPS, Take 1 capsule by mouth daily., Disp: , Rfl:  .  omeprazole (PRILOSEC) 40 MG capsule, TAKE 1 CAPSULE EVERY DAY, Disp: 30 capsule, Rfl: 5 .  Respiratory Therapy Supplies (FLUTTER) DEVI, Use daily as directed, Disp: 1 each, Rfl: 0 .  rosuvastatin (CRESTOR) 10 MG tablet, Take 10 mg by mouth daily., Disp: , Rfl:  .  traMADol (ULTRAM) 50 MG tablet, Take 1 tablet (50 mg total) by mouth every 6 (six) hours as needed (cough)., Disp: 45 tablet, Rfl: 1 .  triamcinolone (NASACORT ALLERGY 24HR) 55 MCG/ACT AERO nasal inhaler, Place 2 sprays into the nose daily., Disp: , Rfl:  .  verapamil (VERELAN PM) 240 MG 24 hr capsule, Take 240 mg  by mouth 2 (two) times daily., Disp: , Rfl:  .  vitamin C (ASCORBIC ACID) 500 MG tablet, Take 500 mg by mouth daily., Disp: , Rfl:

## 2018-01-07 NOTE — Patient Instructions (Signed)
Trouble swallowing/food "going down the wrong pipe": We will arrange for a modified barium swallow test at [facility]  Gastroesophageal reflux disease: Continue taking omeprazole and Pepcid as you are doing  Chronic cough: Continue taking gabapentin 600 mg 3 times a day If you have problems with a cough or cold please let us know right away  We will see you back in 6 months or sooner if needed

## 2018-02-03 ENCOUNTER — Ambulatory Visit
Admission: RE | Admit: 2018-02-03 | Discharge: 2018-02-03 | Disposition: A | Discharge status: Home / Self Care | Payer: 59 | Source: Ambulatory Visit | Attending: Pulmonary Disease | Admitting: Pulmonary Disease

## 2018-02-03 DIAGNOSIS — R05 Cough: Secondary | ICD-10-CM | POA: Diagnosis present

## 2018-02-03 DIAGNOSIS — R059 Cough, unspecified: Secondary | ICD-10-CM

## 2018-02-03 DIAGNOSIS — I10 Essential (primary) hypertension: Secondary | ICD-10-CM | POA: Diagnosis not present

## 2018-02-03 DIAGNOSIS — E7800 Pure hypercholesterolemia, unspecified: Secondary | ICD-10-CM | POA: Insufficient documentation

## 2018-02-03 DIAGNOSIS — R1312 Dysphagia, oropharyngeal phase: Secondary | ICD-10-CM | POA: Insufficient documentation

## 2018-02-03 NOTE — Therapy (Signed)
[facility] DIAG[facility]STIC RADIOLOGY [address] [place], [place], [address] Phone: [phone]   Fax:     Modified Barium Swallow  Patient Details  Name: Jill Crawford MRN: [id number] Date of Birth: 01/07/1956 No data recorded  Encounter Date: 02/03/2018  End of Session - 02/03/18 1332    Visit Number  1    Number of Visits  1    Date for SLP Re-Evaluation  02/03/18       Past Medical History:  Diagnosis Date  . Hypercholesteremia   . Hypertension   . Seasonal allergies     Past Surgical History:  Procedure Laterality Date  . CARPAL TUNNEL RELEASE    . THYROID SURGERY    . TONSILLECTOMY      There were no vitals filed for this visit.     Subjective: Patient behavior: (alertness, ability to follow instructions, etc.):  The patient is able to verbalize her swallowing history and follow directions.  Chief complaint: chronic cough, some associated with eating/drinking   Objective:  Radiological Procedure: A videoflouroscopic evaluation of oral-preparatory, reflex initiation, and pharyngeal phases of the swallow was performed; as well as a screening of the upper esophageal phase.  I. POSTURE: Upright in MBS chair and standing  II. VIEW: Lateral and A-P  III. COMPENSATORY STRATEGIES: N/A  IV. BOLUSES ADMINISTERED:   Thin Liquid: 1 small, 3 rapid consecutive   Nectar-thick Liquid: 2 moderate   Honey-thick Liquid: DNT   Puree: 3 teaspoon presentations   Mechanical Soft: 1/4 graham cracker in applesauce  V. RESULTS OF EVALUATION: A. ORAL PREPARATORY PHASE: (The lips, tongue, and velum are observed for strength and coordination)       **Overall Severity Rating: Within normal limits  B. SWALLOW INITIATION/REFLEX: (The reflex is normal if "triggered" by the time the bolus reached the base of the tongue)  **Overall Severity Rating: Mild; triggers while falling from the valleculae to the pyriform  sinuses  C. PHARYNGEAL PHASE: (Pharyngeal function is normal if the bolus shows rapid, smooth, and continuous transit through the pharynx and there is no pharyngeal residue after the swallow)  **Overall Severity Rating: Within normal limits  D. LARYNGEAL PENETRATION: (Material entering into the laryngeal inlet/vestibule but not aspirated) None  E. ASPIRATION: None  F. ESOPHAGEAL PHASE: (Screening of the upper esophagus): An esophageal sweep in the upright position with liquid and solid consistencies showed mid-esophageal retention of solid without retrograde flow.  ASSESSMENT: This 62 year old woman; with chronic cough; is presenting with minimal oropharyngeal dysphagia characterized by delayed pharyngeal swallow initiation. Oral control of the bolus including oral hold, rotary mastication, and anterior to posterior transfer is within normal limits.    Aspects of the pharyngeal stage of swallowing including tongue base retraction, hyolaryngeal excursion, epiglottic inversion, and duration/amplitude of UES opening are within normal limits.  There is no observed pharyngeal residue, laryngeal penetration, or tracheal aspiration.  The patient's complaints do not appear to be due to oropharyngeal swallow function.  Delayed pharyngeal swallow initiation is consistent with effects of laryngopharyngeal reflux (inflammation, edema, and resultant decreased sensation of the larynx and pharynx).  The patient was provided with information regarding cough distraction/rescue techniques.  PLAN/RECOMMENDATIONS:   A. Diet: Regular   B. Swallowing Precautions: Try cough distraction and rescue techniques to reduce frequency and duration of coughing   C. Recommended consultation to: follow up with MDs as recommended   D. Therapy recommendations: speech therapy is not indicated   E. Results and  recommendations were discussed with the patient immediately following the study and the final report routed to the  referring MD.      Dysphagia, oropharyngeal phase  Cough - Plan: DG Swallowing Func-Speech Pathology, DG Swallowing Func-Speech Pathology        Problem List Patient Active Problem List   Diagnosis Date Noted  . Allergic rhinitis 08/08/2017  . Acute bronchitis 07/14/2015  . Vocal cord dysfunction 04/27/2015  . Cough 08/18/2014  . Abnormal CT scan, chest 08/18/2014   Dollene Primrose, MS/CCC- SLP  Leandrew Koyanagi 02/03/2018, 1:33 PM  [facility] DIAGNOSTIC RADIOLOGY [address] [place], [place], [address] Phone: [phone]   Fax:     Name: Jill Crawford MRN: [id number] Date of Birth: 1956/03/19

## 2018-02-19 ENCOUNTER — Other Ambulatory Visit: Payer: Self-pay | Admitting: Pulmonary Disease

## 2018-03-19 ENCOUNTER — Encounter: Payer: Self-pay | Admitting: *Deleted

## 2018-03-20 ENCOUNTER — Ambulatory Visit: Payer: 59 | Admitting: Anesthesiology

## 2018-03-20 ENCOUNTER — Ambulatory Visit
Admission: RE | Admit: 2018-03-20 | Discharge: 2018-03-20 | Disposition: A | Discharge status: Home / Self Care | Payer: 59 | Source: Ambulatory Visit | Attending: Unknown Physician Specialty | Admitting: Unknown Physician Specialty

## 2018-03-20 ENCOUNTER — Encounter
Admission: RE | Disposition: A | Discharge status: Home / Self Care | Payer: Self-pay | Source: Ambulatory Visit | Attending: Unknown Physician Specialty

## 2018-03-20 ENCOUNTER — Encounter: Payer: Self-pay | Admitting: *Deleted

## 2018-03-20 DIAGNOSIS — E039 Hypothyroidism, unspecified: Secondary | ICD-10-CM | POA: Diagnosis not present

## 2018-03-20 DIAGNOSIS — Z800 Family history of malignant neoplasm of digestive organs: Secondary | ICD-10-CM | POA: Diagnosis not present

## 2018-03-20 DIAGNOSIS — Z79899 Other long term (current) drug therapy: Secondary | ICD-10-CM | POA: Diagnosis not present

## 2018-03-20 DIAGNOSIS — E7800 Pure hypercholesterolemia, unspecified: Secondary | ICD-10-CM | POA: Diagnosis not present

## 2018-03-20 DIAGNOSIS — I10 Essential (primary) hypertension: Secondary | ICD-10-CM | POA: Diagnosis not present

## 2018-03-20 DIAGNOSIS — Z1211 Encounter for screening for malignant neoplasm of colon: Secondary | ICD-10-CM | POA: Diagnosis present

## 2018-03-20 DIAGNOSIS — M1990 Unspecified osteoarthritis, unspecified site: Secondary | ICD-10-CM | POA: Insufficient documentation

## 2018-03-20 DIAGNOSIS — K640 First degree hemorrhoids: Secondary | ICD-10-CM | POA: Diagnosis not present

## 2018-03-20 DIAGNOSIS — Z7982 Long term (current) use of aspirin: Secondary | ICD-10-CM | POA: Insufficient documentation

## 2018-03-20 HISTORY — DX: Right lower quadrant pain: R10.31

## 2018-03-20 HISTORY — DX: Dermatitis, unspecified: L30.9

## 2018-03-20 HISTORY — DX: Unspecified osteoarthritis, unspecified site: M19.90

## 2018-03-20 HISTORY — PX: COLONOSCOPY WITH PROPOFOL: SHX5780

## 2018-03-20 HISTORY — DX: Hypothyroidism, unspecified: E03.9

## 2018-03-20 SURGERY — COLONOSCOPY WITH PROPOFOL
Anesthesia: General

## 2018-03-20 MED ORDER — MIDAZOLAM HCL 2 MG/2ML IJ SOLN
INTRAMUSCULAR | Status: DC | PRN
  Administered 2018-03-20: 2 mg via INTRAVENOUS

## 2018-03-20 MED ORDER — FENTANYL CITRATE (PF) 100 MCG/2ML IJ SOLN
INTRAMUSCULAR | Status: DC | PRN
  Administered 2018-03-20: 50 ug via INTRAVENOUS

## 2018-03-20 MED ORDER — PROPOFOL 500 MG/50ML IV EMUL
INTRAVENOUS | Status: AC
  Filled 2018-03-20: qty 50

## 2018-03-20 MED ORDER — FENTANYL CITRATE (PF) 100 MCG/2ML IJ SOLN
INTRAMUSCULAR | Status: AC
  Filled 2018-03-20: qty 2

## 2018-03-20 MED ORDER — PROPOFOL 500 MG/50ML IV EMUL
INTRAVENOUS | Status: DC | PRN
  Administered 2018-03-20: 120 ug/kg/min via INTRAVENOUS

## 2018-03-20 MED ORDER — MIDAZOLAM HCL 2 MG/2ML IJ SOLN
INTRAMUSCULAR | Status: AC
  Filled 2018-03-20: qty 2

## 2018-03-20 MED ORDER — EPHEDRINE SULFATE 50 MG/ML IJ SOLN
INTRAMUSCULAR | Status: AC
  Filled 2018-03-20: qty 1

## 2018-03-20 MED ORDER — SODIUM CHLORIDE 0.9 % IV SOLN
INTRAVENOUS | Status: DC
  Administered 2018-03-20: 1000 mL via INTRAVENOUS

## 2018-03-20 MED ORDER — SODIUM CHLORIDE 0.9 % IV SOLN: INTRAVENOUS | Status: DC

## 2018-03-20 NOTE — Anesthesia Preprocedure Evaluation (Signed)
Anesthesia Evaluation  Patient identified by MRN, date of birth, ID band Patient awake    Reviewed: Allergy & Precautions, H&P , NPO status , Patient's Chart, lab work & pertinent test results, reviewed documented beta blocker date and time   Airway Mallampati: II   Neck ROM: full    Dental  (+) Poor Dentition   Pulmonary neg pulmonary ROS,    Pulmonary exam normal        Cardiovascular Exercise Tolerance: Good hypertension, On Medications negative cardio ROS Normal cardiovascular exam Rhythm:regular Rate:Normal     Neuro/Psych negative neurological ROS  negative psych ROS   GI/Hepatic negative GI ROS, Neg liver ROS,   Endo/Other  negative endocrine ROSHypothyroidism   Renal/GU negative Renal ROS  negative genitourinary   Musculoskeletal   Abdominal   Peds  Hematology negative hematology ROS (+)   Anesthesia Other Findings Past Medical History: No date: Arthritis     Comment:  hands No date: Eczema No date: Hypercholesteremia No date: Hypertension No date: Hypothyroidism No date: RLQ abdominal pain No date: Seasonal allergies Past Surgical History: No date: CARPAL TUNNEL RELEASE No date: history of adenomatous polyp of colon No date: neuroplasty &/or transposition median No date: THYROID SURGERY No date: TONSILLECTOMY BMI    Body Mass Index:  27.96 kg/m     Reproductive/Obstetrics negative OB ROS                             Anesthesia Physical Anesthesia Plan  ASA: II  Anesthesia Plan: General   Post-op Pain Management:    Induction:   PONV Risk Score and Plan:   Airway Management Planned:   Additional Equipment:   Intra-op Plan:   Post-operative Plan:   Informed Consent: I have reviewed the patients History and Physical, chart, labs and discussed the procedure including the risks, benefits and alternatives for the proposed anesthesia with the patient or  authorized representative who has indicated his/her understanding and acceptance.   Dental Advisory Given  Plan Discussed with: CRNA  Anesthesia Plan Comments:         Anesthesia Quick Evaluation

## 2018-03-20 NOTE — H&P (Signed)
Primary Care Physician:  Leim Fabry, MD Primary Gastroenterologist:  Dr. Mechele Collin  Pre-Procedure History & Physical: HPI:  Jill Crawford is a 62 y.o. female is here for an colonoscopy.  Done for family history of colon cancer.   Past Medical History:  Diagnosis Date  . Arthritis    hands  . Eczema   . Hypercholesteremia   . Hypertension   . Hypothyroidism   . RLQ abdominal pain   . Seasonal allergies     Past Surgical History:  Procedure Laterality Date  . CARPAL TUNNEL RELEASE    . history of adenomatous polyp of colon    . neuroplasty &/or transposition median    . THYROID SURGERY    . TONSILLECTOMY      Prior to Admission medications   Medication Sig Start Date End Date Taking? Authorizing Provider  Acetylcarnitine HCl (ACETYL L-CARNITINE) 500 MG CAPS Take 1 capsule by mouth daily.   Yes [provider]  albuterol (PROVENTIL HFA;VENTOLIN HFA) 108 (90 Base) MCG/ACT inhaler Inhale 2 puffs into the lungs every 6 (six) hours as needed for wheezing or shortness of breath. 10/08/17  Yes Lupita Leash, MD  Alpha-Lipoic Acid 200 MG CAPS Take 1 capsule by mouth daily.   Yes [provider]  azelastine (ASTELIN) 0.1 % nasal spray Place 2 sprays into both nostrils 2 (two) times daily. Use in each nostril as directed 10/08/17  Yes Lupita Leash, MD  b complex vitamins capsule Take 1 capsule by mouth daily.   Yes [provider]  Calcium Carbonate-Vitamin D (CALCIUM-VITAMIN D) 500-200 MG-UNIT per tablet Take 1 tablet by mouth 2 (two) times daily.   Yes [provider]  cetirizine (ZYRTEC) 10 MG tablet Take 10 mg by mouth daily.   Yes [provider]  chlorpheniramine (CHLOR-TRIMETON) 4 MG tablet Take 4 mg by mouth 2 (two) times daily as needed for allergies.   Yes [provider]  chlorpheniramine-HYDROcodone (TUSSIONEX PENNKINETIC ER) 10-8 MG/5ML SUER Take 5 mLs by mouth every 12 (twelve) hours as needed for  cough. 10/08/17  Yes Lupita Leash, MD  Cholecalciferol (VITAMIN D3) 2000 UNITS TABS Take 1 tablet by mouth daily.   Yes [provider]  Coenzyme Q10 (CO Q 10) 100 MG CAPS Take 1 capsule by mouth daily.   Yes [provider]  Flaxseed, Linseed, (FLAXSEED OIL) 1000 MG CAPS Take 1 capsule by mouth daily.   Yes [provider]  gabapentin (NEURONTIN) 300 MG capsule TAKE 3 CAPSULES BY MOUTH 3 TIMES DAILY 02/19/18  Yes Lupita Leash, MD  Garlic 1000 MG CAPS Take 1 capsule by mouth daily.   Yes [provider]  Glucos-Chond-Hyal Ac-Ca Fructo (MOVE FREE JOINT HEALTH ADVANCE PO) Take 1 capsule by mouth daily.   Yes [provider]  Grape Seed Extract 100 MG CAPS Take 1 capsule by mouth daily.   Yes [provider]  L-Lysine 500 MG TABS Take 1 tablet by mouth daily.   Yes [provider]  levothyroxine (SYNTHROID, LEVOTHROID) 25 MCG tablet Take 25 mcg by mouth daily before breakfast.   Yes [provider]  montelukast (SINGULAIR) 10 MG tablet TAKE ONE TABLET AT BEDTIME 07/28/17  Yes Lupita Leash, MD  Multiple Vitamin (MULTIVITAMIN WITH MINERALS) TABS tablet Take 1 tablet by mouth daily.   Yes [provider]  Omega-3 Fatty Acids (FISH OIL) 1200 MG CAPS Take 1 capsule by mouth daily.   Yes [provider]  omeprazole (PRILOSEC) 40 MG capsule TAKE 1 CAPSULE EVERY DAY 09/22/17  Yes Lupita Leash, MD  Respiratory Therapy Supplies (FLUTTER) DEVI Use daily as directed 08/23/14  Yes Lupita Leash, MD  rosuvastatin (CRESTOR) 10 MG tablet Take 10 mg by mouth daily.   Yes [provider]  traMADol (ULTRAM) 50 MG tablet Take 1 tablet (50 mg total) by mouth every 6 (six) hours as needed (cough). 07/19/16  Yes Lupita Leash, MD  triamcinolone (NASACORT ALLERGY 24HR) 55 MCG/ACT AERO nasal inhaler Place 2 sprays into the nose daily.   Yes [provider]  verapamil (VERELAN PM) 240 MG 24 hr  capsule Take 240 mg by mouth 2 (two) times daily.   Yes [provider]  vitamin C (ASCORBIC ACID) 500 MG tablet Take 500 mg by mouth daily.   Yes [provider]    Allergies as of 12/25/2017 - Review Complete 10/08/2017  Allergen Reaction Noted  . Levaquin [levofloxacin in d5w] Itching 08/18/2014  . Penicillins  08/18/2014  . Sulfa antibiotics  08/18/2014  . Aspirin  08/18/2014    Family History  Problem Relation Age of Onset  . Heart disease Father   . Colon cancer Father   . Hypertension Mother   . Breast cancer Neg Hx     Social History   Socioeconomic History  . Marital status: Single    Spouse name: Not on file  . Number of children: Not on file  . Years of education: Not on file  . Highest education level: Not on file  Occupational History  . Not on file  Social Needs  . Financial resource strain: Not on file  . Food insecurity:    Worry: Not on file    Inability: Not on file  . Transportation needs:    Medical: Not on file    Non-medical: Not on file  Tobacco Use  . Smoking status: Never Smoker  . Smokeless tobacco: Never Used  . Tobacco comment: exposed to secondhand smoke growing up.   Substance and Sexual Activity  . Alcohol use: No    Alcohol/week: 0.0 standard drinks  . Drug use: No  . Sexual activity: Not on file  Lifestyle  . Physical activity:    Days per week: Not on file    Minutes per session: Not on file  . Stress: Not on file  Relationships  . Social connections:    Talks on phone: Not on file    Gets together: Not on file    Attends religious service: Not on file    Active member of club or organization: Not on file    Attends meetings of clubs or organizations: Not on file    Relationship status: Not on file  . Intimate partner violence:    Fear of current or ex partner: Not on file    Emotionally abused: Not on file    Physically abused: Not on file    Forced sexual activity: Not on file  Other Topics Concern   . Not on file  Social History Narrative  . Not on file    Review of Systems: See HPI, otherwise negative ROS  Physical Exam: BP (!) 143/52   Pulse 83   Temp (!) 95.8 F (35.4 C) (Tympanic)   Resp 18   Ht 5\' 5"  (1.651 m)   Wt 76.2 kg   SpO2 96%   BMI 27.96 kg/m  General:   Alert,  pleasant and cooperative in NAD Head:  Normocephalic and atraumatic. Neck:  Supple; no masses or thyromegaly. Lungs:  Clear throughout to auscultation.    Heart:  Regular rate and rhythm. Abdomen:  Soft, nontender and nondistended. Normal bowel sounds, without guarding, and without rebound.   Neurologic:  Alert and  oriented x4;  grossly normal neurologically.  Impression/Plan: Jill Crawford is here for an colonoscopy to be performed for family history of colon cancer.  Risks, benefits, limitations, and alternatives regarding  colonoscopy have been reviewed with the patient.  Questions have been answered.  All parties agreeable.   Lynnae Prude, MD  03/20/2018, 10:18 AM

## 2018-03-20 NOTE — Anesthesia Procedure Notes (Signed)
Performed by: Cook-Martin, Floriene Jeschke Pre-anesthesia Checklist: Patient identified, Emergency Drugs available, Suction available, Patient being monitored and Timeout performed Patient Re-evaluated:Patient Re-evaluated prior to induction Oxygen Delivery Method: Nasal cannula Preoxygenation: Pre-oxygenation with 100% oxygen Induction Type: IV induction Placement Confirmation: positive ETCO2 and CO2 detector       

## 2018-03-20 NOTE — Op Note (Signed)
[facility] Gastroenterology Patient Name: Jill Crawford Procedure Date: 03/20/2018 10:27 AM MRN: [id number] Account #: [id number] Date of Birth: Feb 06, 1956 Admit Type: Outpatient Age: 62 Room: [facility] ENDO ROOM 3 Gender: Female Note Status: Finalized Procedure:            Colonoscopy Indications:          Screening for colorectal malignant neoplasm Providers:            Scot Jun, MD Referring MD:         Leim Fabry MD, MD (Referring MD) Medicines:            Propofol per Anesthesia Complications:        No immediate complications. Procedure:            Pre-Anesthesia Assessment:                       - After reviewing the risks and benefits, the patient                        was deemed in satisfactory condition to undergo the                        procedure.                       After obtaining informed consent, the colonoscope was                        passed under direct vision. Throughout the procedure,                        the patient's blood pressure, pulse, and oxygen                        saturations were monitored continuously. The                        Colonoscope was introduced through the anus and                        advanced to the the cecum, identified by appendiceal                        orifice and ileocecal valve. The colonoscopy was                        performed without difficulty. The patient tolerated the                        procedure well. The quality of the bowel preparation                        was excellent. Findings:      Internal hemorrhoids were found during endoscopy. The hemorrhoids were       small and Grade I (internal hemorrhoids that do not prolapse).      The exam was otherwise without abnormality. Impression:           - Internal hemorrhoids.                       - The examination was  otherwise normal.                       - No specimens collected. Recommendation:       - Repeat colonoscopy  in 10 years for screening purposes. Procedure Code(s):    --- Professional ---                       [phone], Colonoscopy, flexible; diagnostic, including                        collection of specimen(s) by brushing or washing, when                        performed (separate procedure) Diagnosis Code(s):    --- Professional ---                       Z12.11, Encounter for screening for malignant neoplasm                        of colon                       K64.0, First degree hemorrhoids CPT copyright 2017 American Medical Association. All rights reserved. The codes documented in this report are preliminary and upon coder review may  be revised to meet current compliance requirements. Scot Jun, MD 03/20/2018 10:48:50 AM This report has been signed electronically. Number of Addenda: 0 Note Initiated On: 03/20/2018 10:27 AM Scope Withdrawal Time: 0 hours 7 minutes 15 seconds  Total Procedure Duration: 0 hours 16 minutes 17 seconds       [facility]

## 2018-03-20 NOTE — Anesthesia Post-op Follow-up Note (Signed)
Anesthesia QCDR form completed.        

## 2018-03-20 NOTE — Transfer of Care (Signed)
Immediate Anesthesia Transfer of Care Note  Patient: Jill Crawford  Procedure(s) Performed: COLONOSCOPY WITH PROPOFOL (N/A )  Patient Location: PACU  Anesthesia Type:General  Level of Consciousness: awake and sedated  Airway & Oxygen Therapy: Patient Spontanous Breathing and Patient connected to nasal cannula oxygen  Post-op Assessment: Report given to RN and Post -op Vital signs reviewed and stable  Post vital signs: Reviewed and stable  Last Vitals:  Vitals Value Taken Time  BP    Temp    Pulse    Resp    SpO2      Last Pain:  Vitals:   03/20/18 0938  TempSrc: Tympanic  PainSc: 0-No pain         Complications: No apparent anesthesia complications

## 2018-03-23 ENCOUNTER — Other Ambulatory Visit: Payer: Self-pay | Admitting: Pulmonary Disease

## 2018-03-23 ENCOUNTER — Encounter: Payer: Self-pay | Admitting: Unknown Physician Specialty

## 2018-03-31 NOTE — Anesthesia Postprocedure Evaluation (Signed)
Anesthesia Post Note  Patient: Jill SableSusan Denise Altic  Procedure(s) Performed: COLONOSCOPY WITH PROPOFOL (N/A )  Patient location during evaluation: PACU Anesthesia Type: General Level of consciousness: awake and alert Pain management: pain level controlled Vital Signs Assessment: post-procedure vital signs reviewed and stable Respiratory status: spontaneous breathing, nonlabored ventilation, respiratory function stable and patient connected to nasal cannula oxygen Cardiovascular status: blood pressure returned to baseline and stable Postop Assessment: no apparent nausea or vomiting Anesthetic complications: no     Last Vitals:  Vitals:   03/20/18 1101 03/20/18 1111  BP: 119/69 117/65  Pulse: 70 68  Resp: 20 20  Temp:    SpO2: 97% 97%    Last Pain:  Vitals:   03/20/18 1111  TempSrc:   PainSc: 0-No pain                 Yevette EdwardsJames G Adams

## 2018-05-19 ENCOUNTER — Other Ambulatory Visit: Payer: Self-pay | Admitting: Family Medicine

## 2018-05-19 DIAGNOSIS — Z1231 Encounter for screening mammogram for malignant neoplasm of breast: Secondary | ICD-10-CM

## 2018-06-15 ENCOUNTER — Ambulatory Visit (INDEPENDENT_AMBULATORY_CARE_PROVIDER_SITE_OTHER): Payer: 59 | Admitting: Pulmonary Disease

## 2018-06-15 ENCOUNTER — Telehealth: Payer: Self-pay | Admitting: Pulmonary Disease

## 2018-06-15 ENCOUNTER — Encounter: Payer: Self-pay | Admitting: Pulmonary Disease

## 2018-06-15 VITALS — BP 134/80 | HR 95 | Ht 62.99 in | Wt 173.2 lb

## 2018-06-15 DIAGNOSIS — R05 Cough: Secondary | ICD-10-CM

## 2018-06-15 DIAGNOSIS — K219 Gastro-esophageal reflux disease without esophagitis: Secondary | ICD-10-CM | POA: Diagnosis not present

## 2018-06-15 DIAGNOSIS — J309 Allergic rhinitis, unspecified: Secondary | ICD-10-CM

## 2018-06-15 DIAGNOSIS — R059 Cough, unspecified: Secondary | ICD-10-CM

## 2018-06-15 MED ORDER — PREDNISONE 20 MG PO TABS: 20.0000 mg | ORAL_TABLET | Freq: Every day | ORAL | 0 refills | Status: AC

## 2018-06-15 MED ORDER — HYDROCOD POLST-CPM POLST ER 10-8 MG/5ML PO SUER: 5.0000 mL | Freq: Two times a day (BID) | ORAL | 0 refills | Status: DC | PRN

## 2018-06-15 NOTE — Telephone Encounter (Signed)
PCCM:  Pharmacy calling needing a prescription for the perdnisone that was ordered today.   I gave them a verbal order for 20mg  Prednisone X 5 days as recommended today by Dr. Kendrick FriesMcQuaid.   Josephine IgoBradley L Nettie Wyffels, DO [facility] 06/15/2018 5:28 PM

## 2018-06-15 NOTE — Patient Instructions (Addendum)
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  GERD: Continue omeprazole and ranitidine   Allergic rhinitis: Continue cetirizine Continue flonase Continue montelukast  Follow-up in 2 weeks with a nurse practitioner

## 2018-06-15 NOTE — Progress Notes (Signed)
Subjective:    Patient ID: Jill Crawford, female    DOB: 07-19-1955, 62 y.o.   MRN: [id number]    Synopsis: Valaria Kohut was referred to the [facility] pulmonary clinic in 2016 for evaluation of cough and dyspnea.  She had a history of recurrent bronchitis and a very minimal smoking history (less than one week in college).  PFT in 2016 was normal, CXR showed bronchitis.  08/2014 CT chest ([place] read)> was initially read as abnormal with some inflammatory changes but this cleared up on a repeat CT chest.     HPI Chief Complaint  Patient presents with  . Follow-up    cough spasms, yellow mucous, chest tightness   [name] is coughing again > she is occasionally producing yellow mucus > she says that once the weather changed she started coughing a lot > she says that she has a sensation of spasms in her throat and then starts coughing > she says that Friday afternoon she had a bad coughing spell repeatedly and  > the phlegm has been yellow, thick lately, this is not typical for her > she has more chest tightness, headache chills > her family last week was sick  She has a very small amount of tussionex left over, some tramadol, hasn't used it.  She has ben using Delsym more lately, but this isn't helping as much.   She has been sleeping with ehr head elevated lately to manage the phlegm.  She is still taking gabapentin and the acid reflux.    Prior to all this her cough was well controlled.    Past Medical History:  Diagnosis Date  . Arthritis    hands  . Eczema   . Hypercholesteremia   . Hypertension   . Hypothyroidism   . RLQ abdominal pain   . Seasonal allergies      Review of Systems  Constitutional: Positive for fatigue. Negative for chills and fever.  HENT: Negative for rhinorrhea, sinus pressure and sneezing.   Respiratory: Positive for cough. Negative for shortness of breath and wheezing.   Cardiovascular: Negative for chest pain, palpitations and  leg swelling.       Objective:   Physical Exam  Vitals:   06/15/18 1529  BP: 134/80  Pulse: 95  SpO2: 94%  Weight: 173 lb 3.2 oz (78.6 kg)  Height: 5' 2.99" (1.6 m)  RA  Gen: well appearing but occassonal harsh cough HENT: OP clear, TM's clear, neck supple PULM: CTA B, normal percussion CV: RRR, no mgr, trace edema GI: BS+, soft, nontender Derm: no cyanosis or rash Psyche: normal mood and affect     PFT: October 2016 pulmonary function testing ratio 90%, FEV1 2.43 L (93% predicted) this he is, total lung capacity 4.45 L (88% predicted), DLCO 23.92 (98% predicted). April 2017 pulmonary function testing ratio 88%, FVC 2.69 L, total lung capacity 4.83 L, DLCO 21.66 (89% predicted)  Chest imaging: April 2017 high-resolution CT scan. Findings previously worrisome for NSIP have now completely resolved. No evidence of underlying pulmonary parenchymal disease.      Assessment & Plan:   No diagnosis found.  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  This is moderately well controlled in the summer months with gabapentin but whenever they turn the heat on and the air in her house becomes dry she starts having more problems.  There has never been convincing evidence of an underlying lung disease  with repeated chest imaging.  Lung function testing has been stable as well.  Her cough is due to ongoing laryngeal irritation.  Gastroesophageal reflux disease makes it worse.  Right now she is experiencing a severe flare of her chronic cough because of a viral illness.  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  GERD: Continue omeprazole and ranitidine   Allergic rhinitis: Continue cetirizine Continue flonase Continue montelukast  Follow-up in 2 weeks with a nurse practitioner   Current Outpatient Medications:  .  Acetylcarnitine HCl (ACETYL L-CARNITINE) 500 MG CAPS, Take 1 capsule by mouth daily., Disp: , Rfl:  .  albuterol (PROVENTIL HFA;VENTOLIN HFA) 108 (90 Base) MCG/ACT inhaler, Inhale 2 puffs into the lungs every 6 (six) hours as needed for wheezing or shortness of breath., Disp: 1 Inhaler, Rfl: 5 .  Alpha-Lipoic Acid 200 MG CAPS, Take 1 capsule by mouth daily., Disp: , Rfl:  .  azelastine (ASTELIN) 0.1 % nasal spray, Place 2 sprays into both nostrils 2 (two) times daily. Use in each nostril as directed, Disp: 30 mL, Rfl: 5 .  b complex vitamins capsule, Take 1 capsule by mouth daily., Disp: , Rfl:  .  Calcium Carbonate-Vitamin D (CALCIUM-VITAMIN D) 500-200 MG-UNIT per tablet, Take 1 tablet by mouth 2 (two) times daily., Disp: , Rfl:  .  cetirizine (ZYRTEC) 10 MG tablet, Take 10 mg by mouth daily., Disp: , Rfl:  .  chlorpheniramine (CHLOR-TRIMETON) 4 MG tablet, Take 4 mg by mouth 2 (two) times daily as needed for allergies., Disp: , Rfl:  .  chlorpheniramine-HYDROcodone (TUSSIONEX PENNKINETIC ER) 10-8 MG/5ML SUER, Take 5 mLs by mouth every 12 (twelve) hours as needed for cough., Disp: 140 mL, Rfl: 0 .  Cholecalciferol (VITAMIN D3) 2000 UNITS TABS, Take 1 tablet by mouth daily., Disp: , Rfl:  .  Coenzyme Q10 (CO Q 10) 100 MG CAPS, Take 1 capsule by mouth daily., Disp: , Rfl:  .  Flaxseed, Linseed, (FLAXSEED OIL) 1000 MG CAPS, Take 1 capsule by mouth daily., Disp: , Rfl:  .  gabapentin (NEURONTIN) 300 MG capsule, TAKE 3  CAPSULES BY MOUTH 3 TIMES DAILY, Disp: 279 capsule, Rfl: 4 .  Garlic 1000 MG CAPS, Take 1 capsule by mouth daily., Disp: , Rfl:  .  Glucos-Chond-Hyal Ac-Ca Fructo (MOVE FREE JOINT HEALTH ADVANCE PO), Take 1 capsule by mouth daily., Disp: , Rfl:  .  Grape Seed Extract 100 MG CAPS, Take 1 capsule by mouth daily., Disp: , Rfl:  .  L-Lysine 500 MG TABS, Take 1 tablet by mouth daily., Disp: , Rfl:  .  levothyroxine (SYNTHROID, LEVOTHROID) 25 MCG tablet, Take 25 mcg by mouth daily before breakfast., Disp: , Rfl:  .  montelukast (SINGULAIR) 10 MG tablet, TAKE 1 TABLET BY MOUTH AT BEDTIME, Disp: 30 tablet, Rfl: 5 .  Multiple Vitamin (MULTIVITAMIN WITH MINERALS) TABS tablet, Take  1 tablet by mouth daily., Disp: , Rfl:  .  Omega-3 Fatty Acids (FISH OIL) 1200 MG CAPS, Take 1 capsule by mouth daily., Disp: , Rfl:  .  omeprazole (PRILOSEC) 40 MG capsule, TAKE 1 CAPSULE EVERY DAY, Disp: 30 capsule, Rfl: 5 .  Respiratory Therapy Supplies (FLUTTER) DEVI, Use daily as directed, Disp: 1 each, Rfl: 0 .  rosuvastatin (CRESTOR) 10 MG tablet, Take 10 mg by mouth daily., Disp: , Rfl:  .  traMADol (ULTRAM) 50 MG tablet, Take 1 tablet (50 mg total) by mouth every 6 (six) hours as needed (cough)., Disp: 45 tablet, Rfl: 1 .  triamcinolone (NASACORT ALLERGY 24HR) 55 MCG/ACT AERO nasal inhaler, Place 2 sprays into the nose daily., Disp: , Rfl:  .  verapamil (VERELAN PM) 240 MG 24 hr capsule, Take 240 mg by mouth 2 (two) times daily., Disp: , Rfl:  .  vitamin C (ASCORBIC ACID) 500 MG tablet, Take 500 mg by mouth daily., Disp: , Rfl:

## 2018-07-02 ENCOUNTER — Encounter: Payer: Self-pay | Admitting: Pulmonary Disease

## 2018-07-02 ENCOUNTER — Ambulatory Visit
Admission: RE | Admit: 2018-07-02 | Discharge: 2018-07-02 | Disposition: A | Discharge status: Home / Self Care | Payer: 59 | Source: Ambulatory Visit | Attending: Family Medicine | Admitting: Family Medicine

## 2018-07-02 ENCOUNTER — Ambulatory Visit (INDEPENDENT_AMBULATORY_CARE_PROVIDER_SITE_OTHER): Payer: 59 | Admitting: Pulmonary Disease

## 2018-07-02 VITALS — BP 118/80 | HR 84 | Ht 65.0 in | Wt 174.0 lb

## 2018-07-02 DIAGNOSIS — R059 Cough, unspecified: Secondary | ICD-10-CM

## 2018-07-02 DIAGNOSIS — R05 Cough: Secondary | ICD-10-CM

## 2018-07-02 DIAGNOSIS — Z1231 Encounter for screening mammogram for malignant neoplasm of breast: Secondary | ICD-10-CM

## 2018-07-02 DIAGNOSIS — J309 Allergic rhinitis, unspecified: Secondary | ICD-10-CM

## 2018-07-02 MED ORDER — AMITRIPTYLINE HCL 25 MG PO TABS: 25.0000 mg | ORAL_TABLET | Freq: Every day | ORAL | 3 refills | Status: DC

## 2018-07-02 NOTE — Assessment & Plan Note (Signed)
  Continue cetirizine Continue flonase and astelin Continue montelukast  Follow up in 4-6 weeks

## 2018-07-02 NOTE — Assessment & Plan Note (Addendum)
Start Elavil 25 mg at night  >>>off label use for chronic cough management  >>>pt to monitor for worsened sedation or psychiatric changes   Continue gabapentin as prescribed >>>gabapentin for unexplained chronic cough >>>Based off of January/2016 CHEST guideline and Expert Panel Report    Continue omeprazole and ranitidine   Continue cetirizine Continue flonase and astelin Continue montelukast  I suggest you stop oil based vitamins   Cough Home Instructions:  We believe you have a chronic/cyclical cough that is aggravated by reflux , coughing , and drainage.  . Goal is to not Cough or clear throat.  Marland Kitchen. Avoid coughing or clearing throat by using:  o non-mint products/sugarless candy o Water o ice chips o Remember NO MINT PRODUCTS   Follow up in 4-6 weeks

## 2018-07-02 NOTE — Patient Instructions (Addendum)
Start Elavil 25 mg at night   Continue gabapentin as prescribed  Continue omeprazole and ranitidine   Continue cetirizine Continue flonase Continue montelukast  I suggest you stop oil based vitamins   Cough Home Instructions:  We believe you have a chronic/cyclical cough that is aggravated by reflux , coughing , and drainage.  . Goal is to not Cough or clear throat.  Marland Kitchen. Avoid coughing or clearing throat by using:  o non-mint products/sugarless candy o Water o ice chips o Remember NO MINT PRODUCTS     Follow up in 4-6 weeks    It is flu season:   >>>Remember to be washing your hands regularly, using hand sanitizer, be careful to use around herself with has contact with people who are sick will increase her chances of getting sick yourself. >>> Best ways to protect herself from the flu: Receive the yearly flu vaccine, practice good hand hygiene washing with soap and also using hand sanitizer when available, eat a nutritious meals, get adequate rest, hydrate appropriately   Please contact the office if your symptoms worsen or you have concerns that you are not improving.   Thank you for choosing [facility] for your healthcare, and for allowing us to partner with you on your healthcare journey. I am thankful to be able to provide care to you today.   Elisha HeadlandBrian Jameelah Watts FNP-C

## 2018-07-02 NOTE — Progress Notes (Signed)
@Patient  ID: Jill Crawford, female    DOB: 04-25-1956, 62 y.o.   MRN: [id number]  Chief Complaint  Patient presents with  . Follow-up    Cough     Referring provider: Leim Fabry, MD  HPI:  62 year old female never smoker initially referred to the [facility] pulmonary clinic in 2016 for evaluation of cough and dyspnea.  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  Smoker/ Smoking History: Never smoker   Pt of: Dr. Kendrick Fries   07/02/2018  - Visit   62 year old female patient presenting today for a 2 week follow-up visit.  Patient reports that her cough improved slightly when she was taking her prednisone with the test Outpatient is reporting allergy to the test next which she believes is directly related to the narcotic.  Patient reports that this is causing her to itch and not allowing her to use the Tussionex as often as she would like.  Patient reports that Delsym over-the-counter cough medicine works just as well as the Tussionex as far as managing her cough.  Patient remains adherent to her gabapentin 600 mg in the morning, 600 mg in the middle of the day, and 900 mg at night.  This helps manage her cough neuropathy patient is interested in starting Elavil as another agent to help control her cough.  Patient also has aggressive allergic rhinitis management with daily Zyrtec's, Singulair, Flonase, and Astelin nasal spray.  Patient also reports continued adherence to omeprazole and ranitidine for GERD management.  Patient believes that her cough is about the same since last visit.  Patient is not sure what else to do.  Patient does continue to take oil-based vitamins which she believes "keep her healthy" she is not interested in stopping taking oil-based vitamins at this time.  Patient also reports that she continues to cough and clear her throat she knows she is not supposed to do this but she cannot see how not to do it.   Tests:  October 2016 pulmonary function  testing ratio 90%, FEV1 2.43 L (93% predicted) this he is, total lung capacity 4.45 L (88% predicted), DLCO 23.92 (98% predicted). April 2017 pulmonary function testing ratio 88%, FVC 2.69 L, total lung capacity 4.83 L, DLCO 21.66 (89% predicted)  Chest imaging: April 2017 high-resolution CT scan. Findings previously worrisome for NSIP have now completely resolved. No evidence of underlying pulmonary parenchymal disease.  FENO:  No results found for: NITRICOXIDE  PFT: PFT Results Latest Ref Rng & Units 11/10/2015 04/27/2015 08/18/2014  FVC-Pre L 2.73 2.69 2.25  FVC-Predicted Pre % 81 80 66  FVC-Post L 2.69 2.70 2.23  FVC-Predicted Post % 80 80 66  Pre FEV1/FVC % % 86 88 93  Post FEV1/FCV % % 88 90 94  FEV1-Pre L 2.36 2.36 2.10  FEV1-Predicted Pre % 90 90 79  FEV1-Post L 2.37 2.43 2.10  DLCO UNC% % 89 98 80  DLCO COR %Predicted % 119 127 107  TLC L 4.83 4.45 4.19  TLC % Predicted % 95 88 83  RV % Predicted % 102 87 83    Imaging: Mm 3d Screen Breast Bilateral  Result Date: 07/02/2018 CLINICAL DATA:  Screening. EXAM: DIGITAL SCREENING BILATERAL MAMMOGRAM WITH TOMO AND CAD COMPARISON:  Previous exam(s). ACR Breast Density Category a: The breast tissue is almost entirely fatty. FINDINGS: There are no findings suspicious for malignancy. Images were processed with CAD. IMPRESSION: No mammographic evidence of malignancy. A result letter of this screening mammogram will be mailed directly  to the patient. RECOMMENDATION: Screening mammogram in one year. (Code:SM-B-01Y) BI-RADS CATEGORY  1: Negative. Electronically Signed   By: Frederico HammanMichelle  Collins M.D.   On: 07/02/2018 09:33    Specialty Problems      Pulmonary Problems   Cough   Vocal cord dysfunction   Acute bronchitis   Allergic rhinitis      Allergies  Allergen Reactions  . Levaquin [Levofloxacin In D5w] Itching    itching  . Penicillins     Serum sickness  . Sulfa Antibiotics     Hives, itching, swelling  . Aspirin      Sensitive in large doses    Immunization History  Administered Date(s) Administered  . Influenza Split 03/18/2014, 04/10/2015, 05/03/2016, 04/15/2017  . Influenza,inj,Quad PF,6+ Mos 04/20/2018  . Pneumococcal Polysaccharide-23 05/15/2004  . Zoster 04/10/2015    Past Medical History:  Diagnosis Date  . Arthritis    hands  . Eczema   . Hypercholesteremia   . Hypertension   . Hypothyroidism   . RLQ abdominal pain   . Seasonal allergies     Tobacco History: Social History   Tobacco Use  Smoking Status Never Smoker  Smokeless Tobacco Never Used  Tobacco Comment   exposed to secondhand smoke growing up.    Counseling given: Yes Comment: exposed to secondhand smoke growing up.    Continue to not smoke  Outpatient Encounter Medications as of 07/02/2018  Medication Sig  . Acetylcarnitine HCl (ACETYL L-CARNITINE) 500 MG CAPS Take 1 capsule by mouth daily.  Marland Kitchen. albuterol (PROVENTIL HFA;VENTOLIN HFA) 108 (90 Base) MCG/ACT inhaler Inhale 2 puffs into the lungs every 6 (six) hours as needed for wheezing or shortness of breath.  . Alpha-Lipoic Acid 200 MG CAPS Take 1 capsule by mouth daily.  Marland Kitchen. azelastine (ASTELIN) 0.1 % nasal spray Place 2 sprays into both nostrils 2 (two) times daily. Use in each nostril as directed  . b complex vitamins capsule Take 1 capsule by mouth daily.  . Calcium Carbonate-Vitamin D (CALCIUM-VITAMIN D) 500-200 MG-UNIT per tablet Take 1 tablet by mouth 2 (two) times daily.  . cetirizine (ZYRTEC) 10 MG tablet Take 10 mg by mouth daily.  . chlorpheniramine (CHLOR-TRIMETON) 4 MG tablet Take 4 mg by mouth 2 (two) times daily as needed for allergies.  . chlorpheniramine-HYDROcodone (TUSSIONEX PENNKINETIC ER) 10-8 MG/5ML SUER Take 5 mLs by mouth every 12 (twelve) hours as needed for cough.  . Cholecalciferol (VITAMIN D3) 2000 UNITS TABS Take 1 tablet by mouth daily.  . Coenzyme Q10 (CO Q 10) 100 MG CAPS Take 1 capsule by mouth daily.  . Flaxseed, Linseed, (FLAXSEED  OIL) 1000 MG CAPS Take 1 capsule by mouth daily.  Marland Kitchen. gabapentin (NEURONTIN) 300 MG capsule TAKE 3 CAPSULES BY MOUTH 3 TIMES DAILY  . Garlic 1000 MG CAPS Take 1 capsule by mouth daily.  . Glucos-Chond-Hyal Ac-Ca Fructo (MOVE FREE JOINT HEALTH ADVANCE PO) Take 1 capsule by mouth daily.  . Grape Seed Extract 100 MG CAPS Take 1 capsule by mouth daily.  Marland Kitchen. L-Lysine 500 MG TABS Take 1 tablet by mouth daily.  Marland Kitchen. levothyroxine (SYNTHROID, LEVOTHROID) 25 MCG tablet Take 25 mcg by mouth daily before breakfast.  . montelukast (SINGULAIR) 10 MG tablet TAKE 1 TABLET BY MOUTH AT BEDTIME  . Multiple Vitamin (MULTIVITAMIN WITH MINERALS) TABS tablet Take 1 tablet by mouth daily.  . Omega-3 Fatty Acids (FISH OIL) 1200 MG CAPS Take 1 capsule by mouth daily.  Marland Kitchen. omeprazole (PRILOSEC) 40 MG capsule TAKE 1  CAPSULE EVERY DAY  . Respiratory Therapy Supplies (FLUTTER) DEVI Use daily as directed  . rosuvastatin (CRESTOR) 10 MG tablet Take 10 mg by mouth daily.  . traMADol (ULTRAM) 50 MG tablet Take 1 tablet (50 mg total) by mouth every 6 (six) hours as needed (cough).  . triamcinolone (NASACORT ALLERGY 24HR) 55 MCG/ACT AERO nasal inhaler Place 2 sprays into the nose daily.  . verapamil (VERELAN PM) 240 MG 24 hr capsule Take 240 mg by mouth 2 (two) times daily.  . vitamin C (ASCORBIC ACID) 500 MG tablet Take 500 mg by mouth daily.  Marland Kitchen amitriptyline (ELAVIL) 25 MG tablet Take 1 tablet (25 mg total) by mouth at bedtime.   No facility-administered encounter medications on file as of 07/02/2018.      Review of Systems  Review of Systems  Constitutional: Positive for fatigue. Negative for chills, fever and unexpected weight change.  HENT: Negative for congestion, ear pain, postnasal drip, sinus pressure and sinus pain.   Respiratory: Positive for cough. Negative for chest tightness, shortness of breath and wheezing.   Cardiovascular: Negative for chest pain and palpitations.  Gastrointestinal: Negative for blood in stool,  diarrhea, nausea and vomiting.       +denies gerd / acid reflux symptoms   Genitourinary: Negative for dysuria, frequency and urgency.  Musculoskeletal: Negative for arthralgias.  Skin: Negative for color change.       +itchy skin from codiene?  Allergic/Immunologic: Negative for environmental allergies and food allergies.  Neurological: Negative for dizziness, light-headedness and headaches.  Psychiatric/Behavioral: Positive for sleep disturbance. Negative for dysphoric mood. The patient is not nervous/anxious.   All other systems reviewed and are negative.    Physical Exam  BP 118/80 (BP Location: Left Arm, Cuff Size: Normal)   Pulse 84   Ht 5\' 5"  (1.651 m)   Wt 174 lb (78.9 kg)   SpO2 93%   BMI 28.96 kg/m   Wt Readings from Last 5 Encounters:  07/02/18 174 lb (78.9 kg)  06/15/18 173 lb 3.2 oz (78.6 kg)  03/20/18 168 lb (76.2 kg)  01/07/18 173 lb (78.5 kg)  10/08/17 172 lb (78 kg)    Physical Exam  Constitutional: She is oriented to person, place, and time and well-developed, well-nourished, and in no distress. No distress.  HENT:  Head: Normocephalic and atraumatic.  Right Ear: Hearing, tympanic membrane, external ear and ear canal normal.  Left Ear: Hearing, tympanic membrane, external ear and ear canal normal.  Nose: Mucosal edema and rhinorrhea present. Right sinus exhibits no maxillary sinus tenderness and no frontal sinus tenderness. Left sinus exhibits no maxillary sinus tenderness and no frontal sinus tenderness.  Mouth/Throat: Uvula is midline and oropharynx is clear and moist. No oropharyngeal exudate.  + Postnasal drip  Eyes: Pupils are equal, round, and reactive to light.  Neck: Normal range of motion. Neck supple.  Cardiovascular: Normal rate, regular rhythm and normal heart sounds.  Pulmonary/Chest: Effort normal and breath sounds normal. No accessory muscle usage. No respiratory distress. She has no decreased breath sounds. She has no wheezes. She has no  rhonchi.  + Patient coughing and clearing throat occasionally throughout exam  Musculoskeletal: Normal range of motion.        General: No edema.  Lymphadenopathy:    She has no cervical adenopathy.  Neurological: She is alert and oriented to person, place, and time. Gait normal.  Skin: Skin is warm and dry. She is not diaphoretic. No erythema.  Psychiatric: Mood, memory, affect and  judgment normal.  Nursing note and vitals reviewed.    Lab Results:  CBC No results found for: WBC, RBC, HGB, HCT, PLT, MCV, MCH, MCHC, RDW, LYMPHSABS, MONOABS, EOSABS, BASOSABS  BMET No results found for: NA, K, CL, CO2, GLUCOSE, BUN, CREATININE, CALCIUM, GFRNONAA, GFRAA  BNP No results found for: BNP  ProBNP No results found for: PROBNP    Assessment & Plan:   62 year old female patient completing follow-up with our office today.  Will start patient on Elavil 25 mg at night for chronic cough management.  We will continue patient on gabapentin for continued cough neuropathy.  I have recommended to the patient she stop using oil-based vitamins as this can definitely contribute to chronic cough.  Patient to follow-up in 4 to 6 weeks or sooner if symptoms worsen while starting Elavil.  Patient to monitor for worsened mental health changes or psychiatric changes when starting Elavil.  Patient also to monitor for increased sedation due to patient's high gabapentin use and Elavil.  Cough Start Elavil 25 mg at night  >>>off label use for chronic cough management  >>>pt to monitor for worsened sedation or psychiatric changes   Continue gabapentin as prescribed >>>gabapentin for unexplained chronic cough >>>Based off of January/2016 CHEST guideline and Expert Panel Report    Continue omeprazole and ranitidine   Continue cetirizine Continue flonase and astelin Continue montelukast  I suggest you stop oil based vitamins   Cough Home Instructions:  We believe you have a chronic/cyclical cough  that is aggravated by reflux , coughing , and drainage.  . Goal is to not Cough or clear throat.  Marland Kitchen. Avoid coughing or clearing throat by using:  o non-mint products/sugarless candy o Water o ice chips o Remember NO MINT PRODUCTS   Follow up in 4-6 weeks   Allergic rhinitis  Continue cetirizine Continue flonase and astelin Continue montelukast  Follow up in 4-6 weeks      Coral CeoBrian P Junior Huezo, NP 07/02/2018   This appointment was 35 minutes along with over 50% of the time in direct face-to-face patient care, assessment, plan of care, and follow-up.

## 2018-07-03 NOTE — Progress Notes (Signed)
Reviewed, discussed, agree 

## 2018-07-24 ENCOUNTER — Telehealth: Payer: Self-pay | Admitting: Pulmonary Disease

## 2018-07-24 MED ORDER — GABAPENTIN 300 MG PO CAPS: ORAL_CAPSULE | ORAL | 4 refills | Status: DC

## 2018-07-24 NOTE — Telephone Encounter (Signed)
This was put in [facility] & Triage Pool. Looks like pt is returning Emily's call.

## 2018-07-24 NOTE — Telephone Encounter (Signed)
Yes okay to refillBrian Mack, FNP

## 2018-07-24 NOTE — Telephone Encounter (Signed)
Spoke with pt, and gave her the message from [place]. Pt understood and nothing further is needed.    Pinion, Farley Ly, CMA    07/24/18 4:18 PM  Note    Rx has been sent in to preferred pharmacy for pt. Attempted to call pt to let her know this had been done but was unable to reach her. Left a detailed message on pt's preferred phone of the work number letting her know that this had been done for her and also left a message on her mobile number for her to return call. Nothing further needed.

## 2018-07-24 NOTE — Telephone Encounter (Signed)
Jill Crawford, please advise if you are okay for [place] to refill pt's gabapentin. Thanks!

## 2018-07-24 NOTE — Telephone Encounter (Signed)
Rx has been sent in to preferred pharmacy for pt. Attempted to call pt to let her know this had been done but was unable to reach her. Left a detailed message on pt's preferred phone of the work number letting her know that this had been done for her and also left a message on her mobile number for her to return call. Nothing further needed.

## 2018-08-19 ENCOUNTER — Encounter: Payer: Self-pay | Admitting: Pulmonary Disease

## 2018-08-19 ENCOUNTER — Ambulatory Visit (INDEPENDENT_AMBULATORY_CARE_PROVIDER_SITE_OTHER): Payer: 59 | Admitting: Pulmonary Disease

## 2018-08-19 VITALS — BP 116/64 | HR 100 | Ht 65.0 in | Wt 175.0 lb

## 2018-08-19 DIAGNOSIS — J309 Allergic rhinitis, unspecified: Secondary | ICD-10-CM

## 2018-08-19 DIAGNOSIS — R0602 Shortness of breath: Secondary | ICD-10-CM | POA: Diagnosis not present

## 2018-08-19 DIAGNOSIS — K219 Gastro-esophageal reflux disease without esophagitis: Secondary | ICD-10-CM

## 2018-08-19 DIAGNOSIS — R05 Cough: Secondary | ICD-10-CM | POA: Diagnosis not present

## 2018-08-19 DIAGNOSIS — R059 Cough, unspecified: Secondary | ICD-10-CM

## 2018-08-19 DIAGNOSIS — J209 Acute bronchitis, unspecified: Secondary | ICD-10-CM

## 2018-08-19 NOTE — Addendum Note (Signed)
Addended by: Demetrio Lapping E on: 08/19/2018 02:50 PM   Modules accepted: Orders

## 2018-08-19 NOTE — Progress Notes (Signed)
Subjective:    Patient ID: Jill SableSusan Denise Crawford, female    DOB: 03/19/1956, 63 y.o.   MRN: [id number]    Synopsis: Jill QuietSusan Crawford was referred to the [facility] [place] pulmonary clinic in 2016 for evaluation of cough and dyspnea.  She had a history of recurrent bronchitis and a very minimal smoking history (less than one week in college).  PFT in 2016 was normal, CXR showed bronchitis.  08/2014 CT chest ([place] read)> was initially read as abnormal with some inflammatory changes but this cleared up on a repeat CT chest.     HPI Chief Complaint  Patient presents with  . Follow-up    pt c/o URI X2 wks, was treated by PCP.  pt is feeling better but not back to baseline.     Jill Crawford had a bad cold a few weeks ago and was seen by her PCP.  She had a bad cough and was producing phlegm.  She was prescribed prednisone and an antibiotic.  She says that she has had stopped up ears which really bothers her.  She has stopped taking her "oil based vitamins" and changed the strategy of her PPI (timing) and her cough improved a little bit (she was also taking elavil).    She tells me that she has multiple humidifiers in her home.  Past Medical History:  Diagnosis Date  . Arthritis    hands  . Eczema   . Hypercholesteremia   . Hypertension   . Hypothyroidism   . RLQ abdominal pain   . Seasonal allergies      Review of Systems  Constitutional: Positive for fatigue. Negative for chills and fever.  HENT: Negative for rhinorrhea, sinus pressure and sneezing.   Respiratory: Positive for cough. Negative for shortness of breath and wheezing.   Cardiovascular: Negative for chest pain, palpitations and leg swelling.       Objective:   Physical Exam  Vitals:   08/19/18 1425  BP: 116/64  Pulse: 100  SpO2: 96%  Weight: 175 lb (79.4 kg)  Height: 5\' 5"  (1.651 m)  RA  Gen: well appearing HENT: OP clear, TM's clear, neck supple PULM: CTA B, normal percussion CV: RRR, no mgr, trace edema GI:  BS+, soft, nontender Derm: no cyanosis or rash Psyche: normal mood and affect      PFT: October 2016 pulmonary function testing ratio 90%, FEV1 2.43 L (93% predicted) this he is, total lung capacity 4.45 L (88% predicted), DLCO 23.92 (98% predicted). April 2017 pulmonary function testing ratio 88%, FVC 2.69 L, total lung capacity 4.83 L, DLCO 21.66 (89% predicted)  Chest imaging: April 2017 high-resolution CT scan. Findings previously worrisome for NSIP have now completely resolved. No evidence of underlying pulmonary parenchymal disease. 2018 CXR: no acute problems  CBC No results found for: WBC, RBC, HGB, HCT, PLT, MCV, MCH, MCHC, RDW, LYMPHSABS, MONOABS, EOSABS, BASOSABS  Records from her most recent visit with us 3 to 4 weeks ago reviewed where she was seen for chronic recurrent cough, adjustments were made to her acid reflux medicines by Elisha HeadlandBrian Mack, Elavil was started 25 mg nightly      Assessment & Plan:   Cough  Shortness of breath - Plan: CT CHEST HIGH RESOLUTION, Hypersensitivity pnuemonitis profile  Allergic rhinitis, unspecified seasonality, unspecified trigger  Gastroesophageal reflux disease without esophagitis  Acute bronchitis, unspecified organism  Discussion: Prior to these most recent upper respiratory infection Jill Crawford was doing fairly well with the addition of Elavil and some changes to  the way she takes her antacids.  However, she had an upper respiratory infection and now are back to ground 0.  She tells me today that she has multiple humidifiers in her home, this is the first time we have ever talked about this.  I wonder if the inflammatory changes we saw on her lungs a number of years ago could be a hypersensitivity pneumonitis type syndrome.  All this being said, she still has an extremely sensitive larynx which causes her cough, she has an embellished cough which she makes essentially no effort to suppress.  I believe that that is the predominant cause  of her ongoing cough and cyclical cough syndrome.  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, cyclical cough: WORSE You need to make some effort to suppress your cough Continue Elavil Continue gabapentin Warm beverages, hard candies to soothe your throat  Sinus pressure/congestion Continue saline rinses Continue mucinex Continue flonase Take pseudophed or afrin for three days only  Recurrent cough and respiratory infection with humidifiers in the home: WORSE As stated today I am concerned that this could be causing an allergic type reaction in your lungs We will check a high-resolution CT scan of your chest and some blood work to assess for this problem  Follow-up with us in 4 to 6 weeks or sooner if needed   Current Outpatient Medications:  .  Acetylcarnitine HCl (ACETYL L-CARNITINE) 500 MG CAPS, Take 1 capsule by mouth daily., Disp: , Rfl:  .  albuterol (PROVENTIL HFA;VENTOLIN HFA) 108 (90 Base) MCG/ACT inhaler, Inhale 2 puffs into the lungs every 6 (six) hours as needed for wheezing or shortness of breath., Disp: 1 Inhaler, Rfl: 5 .  Alpha-Lipoic Acid 200 MG CAPS, Take 1 capsule by mouth daily., Disp: , Rfl:  .  amitriptyline (ELAVIL) 25 MG tablet, Take 1 tablet (25 mg total) by mouth at bedtime., Disp: 30 tablet, Rfl: 3 .  azelastine (ASTELIN) 0.1 % nasal spray, Place 2 sprays into both nostrils 2 (two) times daily. Use in each nostril as directed, Disp: 30 mL, Rfl: 5 .  b complex vitamins capsule, Take 1 capsule by mouth daily., Disp: , Rfl:  .  Calcium Carbonate-Vitamin D (CALCIUM-VITAMIN D) 500-200 MG-UNIT per tablet, Take 1 tablet by mouth 2 (two) times daily., Disp: , Rfl:  .  cetirizine (ZYRTEC) 10 MG tablet, Take 10 mg by mouth daily., Disp: , Rfl:  .  chlorpheniramine (CHLOR-TRIMETON) 4 MG tablet, Take 4 mg by mouth 2 (two) times daily as needed for allergies., Disp: , Rfl:  .  chlorpheniramine-HYDROcodone (TUSSIONEX PENNKINETIC ER) 10-8 MG/5ML SUER, Take 5  mLs by mouth every 12 (twelve) hours as needed for cough., Disp: 140 mL, Rfl: 0 .  Cholecalciferol (VITAMIN D3) 2000 UNITS TABS, Take 1 tablet by mouth daily., Disp: , Rfl:  .  Coenzyme Q10 (CO Q 10) 100 MG CAPS, Take 1 capsule by mouth daily., Disp: , Rfl:  .  Flaxseed, Linseed, (FLAXSEED OIL) 1000 MG CAPS, Take 1 capsule by mouth daily., Disp: , Rfl:  .  gabapentin (NEURONTIN) 300 MG capsule, TAKE 3 CAPSULES BY MOUTH 3 TIMES DAILY, Disp: 279 capsule, Rfl: 4 .  Garlic 1000 MG CAPS, Take 1 capsule by mouth daily., Disp: , Rfl:  .  Glucos-Chond-Hyal Ac-Ca Fructo (MOVE FREE JOINT HEALTH ADVANCE PO), Take 1 capsule by mouth daily., Disp: , Rfl:  .  Grape Seed Extract 100 MG CAPS, Take 1 capsule by mouth daily., Disp: , Rfl:  .  L-Lysine 500 MG TABS, Take 1 tablet by mouth daily., Disp: , Rfl:  .  levothyroxine (SYNTHROID, LEVOTHROID) 25 MCG tablet, Take 25 mcg by mouth daily before breakfast., Disp: , Rfl:  .  montelukast (SINGULAIR) 10 MG tablet, TAKE 1 TABLET BY MOUTH AT BEDTIME, Disp: 30 tablet, Rfl: 5 .  Multiple Vitamin (MULTIVITAMIN WITH MINERALS) TABS tablet, Take 1 tablet by mouth daily., Disp: , Rfl:  .  Omega-3 Fatty Acids (FISH OIL) 1200 MG CAPS, Take 1 capsule by mouth daily., Disp: , Rfl:  .  omeprazole (PRILOSEC) 40 MG capsule, TAKE 1 CAPSULE EVERY DAY, Disp: 30 capsule, Rfl: 5 .  Respiratory Therapy Supplies (FLUTTER) DEVI, Use daily as directed, Disp: 1 each, Rfl: 0 .  rosuvastatin (CRESTOR) 10 MG tablet, Take 10 mg by mouth daily., Disp: , Rfl:  .  traMADol (ULTRAM) 50 MG tablet, Take 1 tablet (50 mg total) by mouth every 6 (six) hours as needed (cough)., Disp: 45 tablet, Rfl: 1 .  triamcinolone (NASACORT ALLERGY 24HR) 55 MCG/ACT AERO nasal inhaler, Place 2 sprays into the nose daily., Disp: , Rfl:  .  verapamil (VERELAN PM) 240 MG 24 hr capsule, Take 240 mg by mouth 2 (two) times daily., Disp: , Rfl:  .  vitamin C (ASCORBIC ACID) 500 MG tablet, Take 500 mg by mouth daily., Disp: ,  Rfl:

## 2018-08-19 NOTE — Patient Instructions (Signed)
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, cyclical cough:  You need to make some effort to suppress your cough Continue Elavil Continue gabapentin Warm beverages, hard candies to soothe your throat  Sinus pressure/congestion Continue saline rinses Continue mucinex Continue flonase Take pseudophed or afrin for three days only  Recurrent cough and respiratory infection with humidifiers in the home:  As stated today I am concerned that this could be causing an allergic type reaction in your lungs We will check a high-resolution CT scan of your chest and some blood work to assess for this problem  Follow-up with [place] in 4 to 6 weeks or sooner if needed

## 2018-08-24 LAB — HYPERSENSITIVITY PNUEMONITIS PROFILE
ASPERGILLUS FUMIGATUS: NEGATIVE
Faenia retivirgula: NEGATIVE
Pigeon Serum: NEGATIVE
S. VIRIDIS: NEGATIVE
T. CANDIDUS: NEGATIVE
T. VULGARIS: NEGATIVE

## 2018-08-26 ENCOUNTER — Telehealth: Payer: Self-pay | Admitting: Pulmonary Disease

## 2018-08-26 NOTE — Telephone Encounter (Signed)
We will leave the CT as scheduled. We need her to continue taking her antibiotics and prednisone  until complete  so that the CT scan gives [place] a clear view of her alveoli to determine if she has pneumonitis. I am glad to hear she is feeling better. Have her call the office for an appointment if she gets worse not better. Thanks so much.

## 2018-08-26 NOTE — Telephone Encounter (Signed)
Will route this to SG as she is APP of the morning. Please advise, thank you.

## 2018-08-26 NOTE — Telephone Encounter (Signed)
Spoke with pt, states she was seen at [facility] yesterday by NP Clent Jacks, had high BP and heart rate, O2 sats in the low 90's- was diagnosed with PNA through a cxr.  Pt was prescribed azithromycin and pred taper yesterday, and has been using albuterol to help with dyspnea and taking tramadol to help suppress cough.    Per pt, [facility] NP Maralyn Sago is wondering if we need to move up pt's CT scan scheduled through our office on 2/21 because of this dx.  Pt would also like to know if we have any additional recs for her.  Her sob and cough are somewhat improved after starting abx and pred taper yesterday.   Pharmacy: [facility].    Please call pt back on her cell [phone]   Sending to app of the morning since BQ is unavailable.  Beth please advise.  Thanks!

## 2018-08-26 NOTE — Telephone Encounter (Signed)
Called patient, unable to reach left message to give us a call back. 

## 2018-08-26 NOTE — Telephone Encounter (Signed)
I apologize I told ashley wrong routing to SG

## 2018-08-26 NOTE — Telephone Encounter (Signed)
I'm usually on call Wednesday afternoons? Who's this morning

## 2018-08-27 ENCOUNTER — Telehealth: Payer: Self-pay | Admitting: Pulmonary Disease

## 2018-08-27 NOTE — Telephone Encounter (Signed)
Spoke with pt, aware of recs.  Nothing further needed at this time- will close encounter.   

## 2018-09-01 NOTE — Telephone Encounter (Signed)
Error

## 2018-09-04 ENCOUNTER — Ambulatory Visit
Admission: RE | Admit: 2018-09-04 | Discharge: 2018-09-04 | Disposition: A | Discharge status: Home / Self Care | Payer: 59 | Source: Ambulatory Visit | Attending: Pulmonary Disease | Admitting: Pulmonary Disease

## 2018-09-04 ENCOUNTER — Other Ambulatory Visit: Payer: Self-pay

## 2018-09-04 DIAGNOSIS — R0602 Shortness of breath: Secondary | ICD-10-CM | POA: Diagnosis not present

## 2018-09-07 ENCOUNTER — Telehealth: Payer: Self-pay | Admitting: Pulmonary Disease

## 2018-09-07 NOTE — Telephone Encounter (Signed)
Called and spoke with Patient.  CT chest results given.  Understanding stated. Nothing further at this time.

## 2018-09-18 ENCOUNTER — Other Ambulatory Visit: Payer: Self-pay | Admitting: Pulmonary Disease

## 2018-09-18 DIAGNOSIS — R059 Cough, unspecified: Secondary | ICD-10-CM

## 2018-09-18 DIAGNOSIS — R05 Cough: Secondary | ICD-10-CM

## 2018-10-27 ENCOUNTER — Ambulatory Visit: Payer: Self-pay | Admitting: Pulmonary Disease

## 2018-11-13 ENCOUNTER — Ambulatory Visit: Payer: Self-pay | Admitting: Pulmonary Disease

## 2018-11-18 ENCOUNTER — Other Ambulatory Visit: Payer: Self-pay | Admitting: Pulmonary Disease

## 2019-01-19 ENCOUNTER — Other Ambulatory Visit: Payer: Self-pay | Admitting: Pulmonary Disease

## 2019-01-19 DIAGNOSIS — R05 Cough: Secondary | ICD-10-CM

## 2019-01-19 DIAGNOSIS — R059 Cough, unspecified: Secondary | ICD-10-CM

## 2019-01-19 NOTE — Telephone Encounter (Signed)
Okay to refill.Wyn Quaker, FNP

## 2019-01-19 NOTE — Telephone Encounter (Signed)
Aaron Edelman, please advise if you are okay with [place] refilling med for pt or if med needs to be handled by pt's PCP. Thanks!  Instructions from [place] 08/19/2018    Return in about 6 weeks (around 09/30/2018). 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, cyclical cough:  You need to make some effort to suppress your cough Continue Elavil Continue gabapentin Warm beverages, hard candies to soothe your throat  Sinus pressure/congestion Continue saline rinses Continue mucinex Continue flonase Take pseudophed or afrin for three days only  Recurrent cough and respiratory infection with humidifiers in the home:  As stated today I am concerned that this could be causing an allergic type reaction in your lungs We will check a high-resolution CT scan of your chest and some blood work to assess for this problem  Follow-up with [place] in 4 to 6 weeks or sooner if needed

## 2019-03-23 ENCOUNTER — Other Ambulatory Visit: Payer: Self-pay | Admitting: Pulmonary Disease

## 2019-05-21 ENCOUNTER — Other Ambulatory Visit: Payer: Self-pay

## 2019-05-21 ENCOUNTER — Ambulatory Visit (INDEPENDENT_AMBULATORY_CARE_PROVIDER_SITE_OTHER): Payer: 59

## 2019-05-21 ENCOUNTER — Encounter: Payer: Self-pay | Admitting: Podiatry

## 2019-05-21 ENCOUNTER — Ambulatory Visit (INDEPENDENT_AMBULATORY_CARE_PROVIDER_SITE_OTHER): Payer: 59 | Admitting: Podiatry

## 2019-05-21 VITALS — BP 130/75 | HR 76 | Resp 16

## 2019-05-21 DIAGNOSIS — M722 Plantar fascial fibromatosis: Secondary | ICD-10-CM

## 2019-05-21 MED ORDER — MELOXICAM 15 MG PO TABS: 15.0000 mg | ORAL_TABLET | Freq: Every day | ORAL | 1 refills | Status: DC

## 2019-05-21 NOTE — Patient Instructions (Signed)

## 2019-05-25 NOTE — Progress Notes (Signed)
   Subjective: 63 y.o. female presenting today as a new patient with a chief complaint of aching right plantar heel pain that began about two months ago. She states the pain is worse in the morning. Standing in the same spot for long periods of time also increases the pain. She has been stretching the foot and doing ROM exercises of the ankle. Patient is here for further evaluation and treatment.    Past Medical History:  Diagnosis Date  . Arthritis    hands  . Eczema   . Hypercholesteremia   . Hypertension   . Hypothyroidism   . RLQ abdominal pain   . Seasonal allergies      Objective: Physical Exam General: The patient is alert and oriented x3 in no acute distress.  Dermatology: Skin is warm, dry and supple bilateral lower extremities. Negative for open lesions or macerations bilateral.   Vascular: Dorsalis Pedis and Posterior Tibial pulses palpable bilateral.  Capillary fill time is immediate to all digits.  Neurological: Epicritic and protective threshold intact bilateral.   Musculoskeletal: Tenderness to palpation to the plantar aspect of the right heel along the plantar fascia. All other joints range of motion within normal limits bilateral. Strength 5/5 in all groups bilateral.   Radiographic exam: Normal osseous mineralization. Joint spaces preserved. No fracture/dislocation/boney destruction. No other soft tissue abnormalities or radiopaque foreign bodies.   Assessment: 1. Plantar fasciitis right  Plan of Care:  1. Patient evaluated. Xrays reviewed.   2. Injection of 0.5cc Celestone soluspan injected into the right plantar fascia  3. Prescription for Meloxicam provided to patient. 4. Instructed patient regarding therapies and modalities at home to alleviate symptoms.  5. Return to clinic in 4 weeks.    Works from home.    Edrick Kins, DPM [facility]  Dr. Edrick Kins, DPM    [address], [place] [address]                Office [phone]  Fax [phone]

## 2019-06-15 ENCOUNTER — Other Ambulatory Visit: Payer: Self-pay | Admitting: Family Medicine

## 2019-06-15 DIAGNOSIS — Z1231 Encounter for screening mammogram for malignant neoplasm of breast: Secondary | ICD-10-CM

## 2019-06-18 ENCOUNTER — Encounter: Payer: Self-pay | Admitting: Podiatry

## 2019-06-18 ENCOUNTER — Other Ambulatory Visit: Payer: Self-pay

## 2019-06-18 ENCOUNTER — Ambulatory Visit (INDEPENDENT_AMBULATORY_CARE_PROVIDER_SITE_OTHER): Payer: 59 | Admitting: Podiatry

## 2019-06-18 DIAGNOSIS — M722 Plantar fascial fibromatosis: Secondary | ICD-10-CM | POA: Diagnosis not present

## 2019-06-18 MED ORDER — METHYLPREDNISOLONE 4 MG PO TBPK: ORAL_TABLET | ORAL | 0 refills | Status: DC

## 2019-06-21 ENCOUNTER — Other Ambulatory Visit: Payer: Self-pay | Admitting: Podiatry

## 2019-06-21 ENCOUNTER — Telehealth: Payer: Self-pay | Admitting: *Deleted

## 2019-06-21 ENCOUNTER — Other Ambulatory Visit: Payer: Self-pay | Admitting: Pulmonary Disease

## 2019-06-21 DIAGNOSIS — R059 Cough, unspecified: Secondary | ICD-10-CM

## 2019-06-21 DIAGNOSIS — R05 Cough: Secondary | ICD-10-CM

## 2019-06-21 MED ORDER — MELOXICAM 15 MG PO TABS: 15.0000 mg | ORAL_TABLET | Freq: Every day | ORAL | 1 refills | Status: DC

## 2019-06-22 NOTE — Progress Notes (Signed)
   Subjective: 63 y.o. female presenting today for follow up evaluation of plantar fasciitis of the right foot. She states the injection helped alleviate the pain for about two days, then returned. She states the pain is intermittent and some days are better than others. She has been taking Meloxicam for treatment. Patient is here for further evaluation and treatment.    Past Medical History:  Diagnosis Date  . Arthritis    hands  . Eczema   . Hypercholesteremia   . Hypertension   . Hypothyroidism   . RLQ abdominal pain   . Seasonal allergies      Objective: Physical Exam General: The patient is alert and oriented x3 in no acute distress.  Dermatology: Skin is warm, dry and supple bilateral lower extremities. Negative for open lesions or macerations bilateral.   Vascular: Dorsalis Pedis and Posterior Tibial pulses palpable bilateral.  Capillary fill time is immediate to all digits.  Neurological: Epicritic and protective threshold intact bilateral.   Musculoskeletal: Tenderness to palpation to the plantar aspect of the right heel along the plantar fascia. All other joints range of motion within normal limits bilateral. Strength 5/5 in all groups bilateral.    Assessment: 1. Plantar fasciitis right  Plan of Care:  1. Patient evaluated.    2. Injection of 0.5cc Celestone soluspan injected into the right plantar fascia  3. Prescription for Medrol Dose Pak provided to patient. Then resume taking Meloxicam. 4. Recommended weightbearing in CAM boot for four weeks. Patient has one at home.   5. Return to clinic in 4 weeks.    Works from home.    Edrick Kins, DPM [facility]  Dr. Edrick Kins, DPM    [address], [place] [address]                Office [phone]  Fax [phone]

## 2019-07-06 ENCOUNTER — Ambulatory Visit
Admission: RE | Admit: 2019-07-06 | Discharge: 2019-07-06 | Disposition: A | Discharge status: Home / Self Care | Payer: 59 | Source: Ambulatory Visit | Attending: Family Medicine | Admitting: Family Medicine

## 2019-07-06 DIAGNOSIS — Z1231 Encounter for screening mammogram for malignant neoplasm of breast: Secondary | ICD-10-CM | POA: Diagnosis not present

## 2019-07-22 NOTE — Telephone Encounter (Signed)
Entered in error

## 2019-07-23 ENCOUNTER — Ambulatory Visit: Payer: 59 | Admitting: Podiatry

## 2019-07-23 ENCOUNTER — Other Ambulatory Visit: Payer: Self-pay | Admitting: Pulmonary Disease

## 2019-07-27 ENCOUNTER — Ambulatory Visit (INDEPENDENT_AMBULATORY_CARE_PROVIDER_SITE_OTHER): Payer: 59 | Admitting: Podiatry

## 2019-07-27 ENCOUNTER — Encounter: Payer: Self-pay | Admitting: Podiatry

## 2019-07-27 ENCOUNTER — Other Ambulatory Visit: Payer: Self-pay

## 2019-07-27 DIAGNOSIS — M722 Plantar fascial fibromatosis: Secondary | ICD-10-CM

## 2019-07-27 MED ORDER — MELOXICAM 15 MG PO TABS: 15.0000 mg | ORAL_TABLET | Freq: Every day | ORAL | 1 refills | Status: DC

## 2019-07-30 ENCOUNTER — Other Ambulatory Visit: Payer: Self-pay | Admitting: Podiatry

## 2019-07-30 NOTE — Progress Notes (Signed)
   Subjective: 64 y.o. female presenting today for follow up evaluation of plantar fasciitis of the right foot. She states she is doing well and has improved but is still experiencing some intermittent soreness. She has been using the CAM boot and taking Meloxicam as directed. Being on the foot increases the pain. Patient is here for further evaluation and treatment.   Past Medical History:  Diagnosis Date  . Arthritis    hands  . Eczema   . Hypercholesteremia   . Hypertension   . Hypothyroidism   . RLQ abdominal pain   . Seasonal allergies      Objective: Physical Exam General: The patient is alert and oriented x3 in no acute distress.  Dermatology: Skin is warm, dry and supple bilateral lower extremities. Negative for open lesions or macerations bilateral.   Vascular: Dorsalis Pedis and Posterior Tibial pulses palpable bilateral.  Capillary fill time is immediate to all digits.  Neurological: Epicritic and protective threshold intact bilateral.   Musculoskeletal: Tenderness to palpation to the plantar aspect of the right heel along the plantar fascia. All other joints range of motion within normal limits bilateral. Strength 5/5 in all groups bilateral.    Assessment: 1. Plantar fasciitis right  Plan of Care:  1. Patient evaluated.    2. Injection of 0.5cc Celestone soluspan injected into the right plantar fascia  3. Night splint dispensed.  4. Discontinue using CAM boot. Resume using OTC insoles from the [employer].  5. Refill prescription for Meloxicam provided to patient.  6. Physical therapy ordered at [facility].  7. Return to clinic in 4 weeks.   Works from home.    Felecia Shelling, DPM [facility]  Dr. Felecia Shelling, DPM    [address] [place], [place] [address]                Office [phone]  Fax [phone]

## 2019-08-20 ENCOUNTER — Other Ambulatory Visit: Payer: Self-pay | Admitting: Pulmonary Disease

## 2019-08-20 NOTE — Telephone Encounter (Signed)
Okay to send in prescription with 0 refills.  Patient needs to be contacted and scheduled for follow-up visit with our office.  She will need to establish with a new pulmonologist is Dr. Kendrick Fries is no longer seeing patients in the outpatient setting with our clinic.Given the fact that she has predominantly been followed for cough my recommendation would be Dr. Sherene Sires in a 30-minute time slot.  The patient is obviously more than welcome to establish with any of our pulmonologist in a 30-minute time slot.  Patient will need to have consult scheduled within the next 4 weeks.Elisha Headland, FNP

## 2019-08-20 NOTE — Telephone Encounter (Signed)
Refill request sent for Gabapentin. Last filled on 03/23/2019 Dr. Kendrick Fries. Last OV:08/19/2018  Arlys John please advise

## 2019-08-23 NOTE — Telephone Encounter (Signed)
Can pt get scheduled with new pulm in slot with all medications in hand.  Elisha Headland FNP

## 2019-08-23 NOTE — Telephone Encounter (Signed)
I called and spoke with the patient and made her aware that her Gabapentin refill has been sent to her pharmacy. I also made her an appt. To get established with a new doc She preferred [place] location. She has been scheduled with Dr. Belia Heman for 09/30/19 at 10:30am.

## 2019-08-24 ENCOUNTER — Other Ambulatory Visit: Payer: Self-pay

## 2019-08-24 ENCOUNTER — Encounter: Payer: Self-pay | Admitting: Podiatry

## 2019-08-24 ENCOUNTER — Ambulatory Visit (INDEPENDENT_AMBULATORY_CARE_PROVIDER_SITE_OTHER): Payer: 59 | Admitting: Podiatry

## 2019-08-24 DIAGNOSIS — M722 Plantar fascial fibromatosis: Secondary | ICD-10-CM | POA: Diagnosis not present

## 2019-08-24 MED ORDER — MELOXICAM 15 MG PO TABS: 15.0000 mg | ORAL_TABLET | Freq: Every day | ORAL | 0 refills | Status: DC

## 2019-08-25 NOTE — Progress Notes (Signed)
   Subjective: 64 y.o. female presenting today for follow up evaluation of plantar fasciitis of the right foot. She states she is doing about 75% better. She reports some continued soreness when standing or walking for long periods of time or when barefoot. She has been taking Meloxicam and using the OTC insoles as directed. Patient is here for further evaluation and treatment.   Past Medical History:  Diagnosis Date  . Arthritis    hands  . Eczema   . Hypercholesteremia   . Hypertension   . Hypothyroidism   . RLQ abdominal pain   . Seasonal allergies      Objective: Physical Exam General: The patient is alert and oriented x3 in no acute distress.  Dermatology: Skin is warm, dry and supple bilateral lower extremities. Negative for open lesions or macerations bilateral.   Vascular: Dorsalis Pedis and Posterior Tibial pulses palpable bilateral.  Capillary fill time is immediate to all digits.  Neurological: Epicritic and protective threshold intact bilateral.   Musculoskeletal: Tenderness to palpation to the plantar aspect of the right heel along the plantar fascia. All other joints range of motion within normal limits bilateral. Strength 5/5 in all groups bilateral.    Assessment: 1. Plantar fasciitis right  Plan of Care:  1. Patient evaluated.    2. Continue physical therapy.  3. Refill prescription for Meloxicam provided to patient.  4. Patient does not see much benefit from injections. Does not want anymore.  5. Continue using OTC insoles.  6. Return to clinic as needed.    Works from home.    Felecia Shelling, DPM [facility]  Dr. Felecia Shelling, DPM    [address] [place], [place] [address]                Office [phone]  Fax [phone]

## 2019-09-17 ENCOUNTER — Other Ambulatory Visit: Payer: Self-pay | Admitting: Pulmonary Disease

## 2019-09-30 ENCOUNTER — Encounter: Payer: Self-pay | Admitting: Internal Medicine

## 2019-09-30 ENCOUNTER — Other Ambulatory Visit: Payer: Self-pay

## 2019-09-30 ENCOUNTER — Ambulatory Visit (INDEPENDENT_AMBULATORY_CARE_PROVIDER_SITE_OTHER): Payer: 59 | Admitting: Internal Medicine

## 2019-09-30 VITALS — BP 138/86 | HR 97 | Temp 97.1°F | Ht 65.0 in | Wt 183.2 lb

## 2019-09-30 DIAGNOSIS — R05 Cough: Secondary | ICD-10-CM

## 2019-09-30 DIAGNOSIS — R059 Cough, unspecified: Secondary | ICD-10-CM

## 2019-09-30 DIAGNOSIS — J45991 Cough variant asthma: Secondary | ICD-10-CM

## 2019-09-30 MED ORDER — ALBUTEROL SULFATE HFA 108 (90 BASE) MCG/ACT IN AERS: 2.0000 | INHALATION_SPRAY | Freq: Four times a day (QID) | RESPIRATORY_TRACT | 10 refills | Status: DC | PRN

## 2019-09-30 MED ORDER — ADVAIR HFA 230-21 MCG/ACT IN AERO: 2.0000 | INHALATION_SPRAY | Freq: Two times a day (BID) | RESPIRATORY_TRACT | 12 refills | Status: DC

## 2019-09-30 NOTE — Progress Notes (Signed)
Subjective:    Patient ID: Jerl Mina, female    DOB: September 21, 1955, 64 y.o.   MRN: [id number]    Synopsis: [place] was referred to the [facility] pulmonary clinic in 2016 for evaluation of cough and dyspnea.  She had a history of recurrent bronchitis and a very minimal smoking history (less than one week in college).  PFT in 2016 was normal, CXR showed bronchitis.  08/2014 CT chest ([place] read)> was initially read as abnormal with some inflammatory changes but this cleared up on a repeat CT chest.  CT chest 2021 No obvious abnormality  CC Follow up Cough  HPI  Persistent chronic nonproductive cough for many years now Patient has had extensive work-up with pulmonary function testing as well as several CT scans The previous CT scan 1 year ago shows no obvious airways disease may be some mild interstitial lung disease Pulmonary function test did not reveal any type of obstructive abnormality  Patient has extensive allergic rhinitis chronically She is always taking Nasacort and antihistamines with Zyrtec along with allergy eyedrops Patient has been seen by ENT in the past  It seems that patient's reflux is under control Patient has a diagnosis of GERD and is started on PPI   Patient has been on albuterol inhaler as needed Patient has never tried inhaled steroids at this time  Patient is exposed to pollen and dust all the time She does live with dogs at home She says she has a lot of dust in the house She has carpeting everywhere  I have explained to patient she may need a bronchoscopy however will try Advair HFA for 1 month and do an assessment        Past Medical History:  Diagnosis Date  . Arthritis    hands  . Eczema   . Hypercholesteremia   . Hypertension   . Hypothyroidism   . RLQ abdominal pain   . Seasonal allergies      Review of Systems:  Gen:  Denies  fever, sweats, chills weight loss  HEENT: Denies blurred vision, double vision,  ear pain, eye pain, hearing loss, nose bleeds, sore throat Cardiac:  No dizziness, chest pain or heaviness, chest tightness,edema, No JVD Resp:  +cough, -sputum production, -shortness of breath,-wheezing, -hemoptysis,  Gi: Denies swallowing difficulty, stomach pain, nausea or vomiting, diarrhea, constipation, bowel incontinence Gu:  Denies bladder incontinence, burning urine Ext:   Denies Joint pain, stiffness or swelling Skin: Denies  skin rash, easy bruising or bleeding or hives Endoc:  Denies polyuria, polydipsia , polyphagia or weight change Psych:   Denies depression, insomnia or hallucinations  Other:  All other systems negative    BP 138/86 (BP Location: Left Arm, Cuff Size: Large)   Pulse 97   Temp (!) 97.1 F (36.2 C) (Temporal)   Ht 5\' 5"  (1.651 m)   Wt 183 lb 3.2 oz (83.1 kg)   SpO2 94%   BMI 30.49 kg/m     Physical Examination:   General Appearance: No distress  Neuro:without focal findings,  speech normal,  HEENT: PERRLA, EOM intact.   Pulmonary: normal breath sounds, No wheezing.  CardiovascularNormal S1,S2.  No m/r/g.   Abdomen: Benign, Soft, non-tender. Renal:  No costovertebral tenderness  GU:  Not performed at this time. Endoc: No evident thyromegaly Skin:   warm, no rashes, no ecchymosis  Extremities: normal, no cyanosis, clubbing. PSYCHIATRIC: Mood, affect within normal limits.   ALL OTHER ROS ARE NEGATIVE  PFT: October 2016 pulmonary function testing ratio 90%, FEV1 2.43 L (93% predicted) this he is, total lung capacity 4.45 L (88% predicted), DLCO 23.92 (98% predicted). April 2017 pulmonary function testing ratio 88%, FVC 2.69 L, total lung capacity 4.83 L, DLCO 21.66 (89% predicted)  Chest imaging: April 2017 high-resolution CT scan. Findings previously worrisome for NSIP have now completely resolved. No evidence of underlying pulmonary parenchymal disease. 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 I do believe that patient does have asthma which is triggered by laughter dust and pollen    At this time I will not recommend bronchoscopy at this time however after 1 month of inhaled corticosteroid therapy and long-acting beta agonist therapy if her cough persists then I will plan for bronchoscopy for airway evaluation.    TOTAL TIME 37 mins  COVID-19 EDUCATION: The signs and symptoms of COVID-19 were discussed with the patient and how to seek care for testing.  The importance of social distancing was discussed today. Hand Washing Techniques and avoid touching face was advised.   COVID VACCINE UP TO DATE    MEDICATION ADJUSTMENTS/LABS AND TESTS ORDERED:    CURRENT MEDICATIONS REVIEWED AT LENGTH WITH PATIENT TODAY   Patient satisfied with Plan of action and management. All questions answered  Follow up in 6 months   Carrie Usery Santiago Glad, M.D.  [facility]  Medical Director [facility] Medical Director [facility] Cardio-Pulmonary Department

## 2019-09-30 NOTE — Patient Instructions (Signed)
START ADVAIR HFA USE AS DIRECTED AVOID ALLERGENS

## 2019-10-20 ENCOUNTER — Other Ambulatory Visit: Payer: Self-pay | Admitting: Internal Medicine

## 2019-10-20 ENCOUNTER — Other Ambulatory Visit: Payer: Self-pay | Admitting: Podiatry

## 2019-11-01 ENCOUNTER — Encounter: Payer: Self-pay | Admitting: Acute Care

## 2019-11-01 ENCOUNTER — Ambulatory Visit (INDEPENDENT_AMBULATORY_CARE_PROVIDER_SITE_OTHER): Payer: 59 | Admitting: Acute Care

## 2019-11-01 ENCOUNTER — Other Ambulatory Visit: Payer: Self-pay

## 2019-11-01 VITALS — BP 120/78 | HR 75 | Temp 98.0°F | Ht 65.0 in | Wt 179.8 lb

## 2019-11-01 DIAGNOSIS — J45991 Cough variant asthma: Secondary | ICD-10-CM

## 2019-11-01 DIAGNOSIS — J309 Allergic rhinitis, unspecified: Secondary | ICD-10-CM | POA: Diagnosis not present

## 2019-11-01 DIAGNOSIS — K219 Gastro-esophageal reflux disease without esophagitis: Secondary | ICD-10-CM | POA: Diagnosis not present

## 2019-11-01 NOTE — Patient Instructions (Addendum)
It is good to see you today. I am glad your cough has improved.  Continue your Gabapentin as you have been doing. ( 600/ 600/ 900)  Continue Advair twice daily as you have been doing Rinse mouth after use Continue Singulair and Zyrtec as you have been doing. Continue Omeprazole daily as you have been doing.  Continue Nasocort daily as you have been doing. Sinus rinses as needed.  I will message Dr. Belia Heman and see if he wants to bronch you as you still have a cough, or if because you have improved, he will just continue to monitor you. Follow up in 2 months with Avamae Dehaan or Dr. Jorge Ny of water instead of throat clearing Delsym Cough syrup 5 cc's every 12 hours as needed Please contact office for sooner follow up if symptoms do not improve or worsen or seek emergency care

## 2019-11-01 NOTE — Progress Notes (Signed)
History of Present Illness Jill Crawford is a 64 y.o. 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 She is followed by Dr. Belia Heman   11/01/2019 Pt. Presents for follow up. She was seen by Dr. Belia Heman 09/30/2019. She was diagnosed with chronic allergic rhinitis with postnasal drip along with reflux with intermittent reactive airways disease when exposed to pollen and dust which likely represents cough variant asthma. She had never been on maintenance therapy, so she was started on Advair HFA 230, advised to rinse mouth after use, and to avoid triggers. He felt her cough was triggered by laughing, dust and pollen and bending over. If her cough did not improve, Dr. Belia Heman was going to do a bronchoscopy.  She presents today for follow up. She states that she feels better. Her cough is better controlled but it is not gone. She is using the Advair twice daily. Since starting Advair  she has not had to use her cough syrup. She feels she is coughing every day, but it is better controlled.. She states the cough is different. It is not a cough that she cannot stop. It is more intermittent.Secretions are now pale yellow to clear, and volume has decreased significantly. She states there has been improvement but not total resolution. She is on Omeprazole. She has been on this for 4 years. She is compliant with her Zyrtec and her Singulair. She is using Chlorpheniramine as needed for her migraine  headaches that she gets due to pollen. She is using her nasocort daily. She is complaining of a headache today. She does use a Zyrtec decongestant as needed for headache.  She coughed once while she was in the office today. She denies fever, chest pain, orthopnea or hemoptysis.   Test Results: HRCT 09/04/2018 Follow up bronchitis/ pneumonia Near complete resolution of findings seen on prior studies, with minimal residual thickening of the peribronchovascular  interstitium and mild peribronchovascular ground-glass attenuation in the right upper lobe near the apex. This pattern is highly nonspecific, and categorized as alternative diagnosis to usual interstitial pneumonia (UIP) per current ATS guidelines. Mild air trapping indicative of mild small airways disease. Aortic atherosclerosis. Old granulomatous disease, as above.     PFT    Component Value Date/Time   FEV1PRE 2.36 11/10/2015 0852   FEV1POST 2.37 11/10/2015 0852   FVCPRE 2.73 11/10/2015 0852   FVCPOST 2.69 11/10/2015 0852   TLC 4.83 11/10/2015 0852   DLCOUNC 21.66 11/10/2015 0852   PREFEV1FVCRT 86 11/10/2015 0852   PSTFEV1FVCRT 88 11/10/2015 0852    No results found.   Past medical hx Past Medical History:  Diagnosis Date  . Arthritis    hands  . Eczema   . Hypercholesteremia   . Hypertension   . Hypothyroidism   . RLQ abdominal pain   . Seasonal allergies      Social History   Tobacco Use  . Smoking status: Passive Smoke Exposure - Never Smoker  . Smokeless tobacco: Never Used  . Tobacco comment: exposed to secondhand smoke growing up.   Substance Use Topics  . Alcohol use: No    Alcohol/week: 0.0 standard drinks  . Drug use: No    Ms.Sides reports that she is a non-smoker but has been exposed to tobacco smoke. She has never used smokeless tobacco. She reports that she does not drink alcohol or use drugs.  Tobacco Cessation: Never smoker Passive smoke exposure as a child  Past surgical hx, Family  hx, Social hx all reviewed.  Current Outpatient Medications on File Prior to Visit  Medication Sig  . Acetylcarnitine HCl (ACETYL L-CARNITINE) 500 MG CAPS Take 1 capsule by mouth daily.  Marland Kitchen albuterol (VENTOLIN HFA) 108 (90 Base) MCG/ACT inhaler Inhale 2 puffs into the lungs every 6 (six) hours as needed for wheezing or shortness of breath.  . Alpha-Lipoic Acid 200 MG CAPS Take 1 capsule by mouth daily.  Marland Kitchen b complex vitamins capsule Take 1 capsule by  mouth daily.  . Calcium Carbonate-Vitamin D (CALCIUM-VITAMIN D) 500-200 MG-UNIT per tablet Take 1 tablet by mouth 2 (two) times daily.  . cetirizine (ZYRTEC) 10 MG tablet Take 10 mg by mouth daily.  . Cholecalciferol (VITAMIN D3) 2000 UNITS TABS Take 1 tablet by mouth daily.  . Coenzyme Q10 (CO Q 10) 100 MG CAPS Take 1 capsule by mouth daily.  . fluticasone-salmeterol (ADVAIR HFA) 230-21 MCG/ACT inhaler Inhale 2 puffs into the lungs 2 (two) times daily.  Marland Kitchen gabapentin (NEURONTIN) 300 MG capsule TAKE 3 CAPSULES 3 TIMES DAILY  . Glucos-Chond-Hyal Ac-Ca Fructo (MOVE FREE JOINT HEALTH ADVANCE PO) Take 1 capsule by mouth daily.  . Grape Seed Extract 100 MG CAPS Take 1 capsule by mouth daily.  Marland Kitchen levothyroxine (SYNTHROID, LEVOTHROID) 25 MCG tablet Take 25 mcg by mouth daily before breakfast.  . montelukast (SINGULAIR) 10 MG tablet TAKE ONE TABLET AT BEDTIME  . Multiple Vitamin (MULTIVITAMIN WITH MINERALS) TABS tablet Take 1 tablet by mouth daily.  Marland Kitchen olopatadine (PATANOL) 0.1 % ophthalmic solution 1 drop 2 (two) times daily.  Marland Kitchen omeprazole (PRILOSEC) 40 MG capsule TAKE 1 CAPSULE EVERY DAY  . rosuvastatin (CRESTOR) 10 MG tablet Take 10 mg by mouth daily.  . traMADol (ULTRAM) 50 MG tablet Take 1 tablet (50 mg total) by mouth every 6 (six) hours as needed (cough).  . triamcinolone (NASACORT ALLERGY 24HR) 55 MCG/ACT AERO nasal inhaler Place 2 sprays into the nose daily.  . verapamil (VERELAN PM) 240 MG 24 hr capsule Take 240 mg by mouth 2 (two) times daily.  . vitamin C (ASCORBIC ACID) 500 MG tablet Take 500 mg by mouth daily.   No current facility-administered medications on file prior to visit.     Allergies  Allergen Reactions  . Levaquin [Levofloxacin In D5w] Itching    itching  . Penicillins     Serum sickness  . Sulfa Antibiotics     Hives, itching, swelling  . Aspirin     Sensitive in large doses    Review Of Systems:  Constitutional:   No  weight loss, night sweats,  Fevers, chills,  fatigue, or  lassitude.  HEENT:   No headaches,  Difficulty swallowing,  Tooth/dental problems, or  Sore throat,                No sneezing, itching, ear ache, nasal congestion, post nasal drip,   CV:  No chest pain,  Orthopnea, PND, swelling in lower extremities, anasarca, dizziness, palpitations, syncope.   GI  No heartburn, indigestion, abdominal pain, nausea, vomiting, diarrhea, change in bowel habits, loss of appetite, bloody stools.   Resp: No shortness of breath with exertion or at rest.  + excess mucus, + but less frequent  productive cough,  No non-productive cough,  No coughing up of blood.  No change in color of mucus.  + rare wheezing.  No chest wall deformity  Skin: no rash or lesions.  GU: no dysuria, change in color of urine, no urgency or frequency.  No flank pain, no hematuria   MS:  No joint pain or swelling.  No decreased range of motion.  No back pain.  Psych:  No change in mood or affect. No depression or anxiety.  No memory loss.   Vital Signs BP 120/78 (BP Location: Left Arm, Cuff Size: Normal)   Pulse 75   Temp 98 F (36.7 C) (Temporal)   Ht 5\' 5"  (1.651 m)   Wt 179 lb 12.8 oz (81.6 kg)   SpO2 96%   BMI 29.92 kg/m    Physical Exam:  General- No distress,  A&Ox3, pleasant ENT: No sinus tenderness, TM clear, pale nasal mucosa, no oral exudate,no post nasal drip, no LAN Cardiac: S1, S2, regular rate and rhythm, no murmur Chest: No wheeze/ rales/ dullness; no accessory muscle use, no nasal flaring, no sternal retractions, diminished per bases Abd.: Soft Non-tender, ND, NT, BS +, Body mass index is 29.92 kg/m. Ext: No clubbing cyanosis, edema Neuro: A&O x 3, MAE x 4, appropriate Skin: No rashes, No lesions, warm and dry Psych: normal mood and behavior   Assessment/Plan Improved Cough Variant Asthma with initiation of maintenance Advair HFA Chronic Allergic Rhinitis with PND and Reflux Intermittent reactive airways disease Plan Continue your  Gabapentin as you have been doing. ( 600/ 600/ 900)  Continue Advair twice daily as you have been doing Rinse mouth after use Continue Singulair and Zyrtec as you have been doing. Continue Omeprazole daily as you have been doing.  Continue Nasocort daily as you have been doing. Sinus rinses as needed.  I will message Dr. and see if he wants to bronch you as you still have a cough, or if because you have improved, he will just continue to monitor you. Follow up in 2 months with Zakkary Thibault or Dr. Belia Heman of water instead of throat clearing Delsym Cough syrup 5 cc's every 12 hours as needed Please contact office for sooner follow up if symptoms do not improve or worsen or seek emergency care    This appointment was 30 min long with over 50% of the time in direct face-to-face patient care, assessment, plan of care, and follow-up.      Jorge Ny, NP 11/01/2019  4:27 PM

## 2019-11-21 ENCOUNTER — Other Ambulatory Visit: Payer: Self-pay | Admitting: Pulmonary Disease

## 2019-12-20 ENCOUNTER — Other Ambulatory Visit: Payer: Self-pay | Admitting: Internal Medicine

## 2020-01-19 ENCOUNTER — Other Ambulatory Visit: Payer: Self-pay | Admitting: Internal Medicine

## 2020-01-28 ENCOUNTER — Encounter: Payer: Self-pay | Admitting: Primary Care

## 2020-01-28 ENCOUNTER — Telehealth: Payer: Self-pay | Admitting: Primary Care

## 2020-01-28 ENCOUNTER — Ambulatory Visit (INDEPENDENT_AMBULATORY_CARE_PROVIDER_SITE_OTHER): Payer: 59 | Admitting: Primary Care

## 2020-01-28 ENCOUNTER — Other Ambulatory Visit: Payer: Self-pay

## 2020-01-28 VITALS — BP 152/84 | HR 89 | Temp 97.7°F | Ht 65.0 in | Wt 181.8 lb

## 2020-01-28 DIAGNOSIS — R05 Cough: Secondary | ICD-10-CM | POA: Diagnosis not present

## 2020-01-28 DIAGNOSIS — R059 Cough, unspecified: Secondary | ICD-10-CM

## 2020-01-28 NOTE — Progress Notes (Signed)
@Patient  ID: , female    DOB: [birth year], 64 y.o.   MRN: [id number]  Chief Complaint  Patient presents with  . Follow-up    pt reports of prod cough with clear to pale yellow mucus.    Referring provider: [id number], MD  HPI: 64 year old female, passive smoke exposure.  Past medical history significant for cough variant asthma, hypertension, aortic arthrosclerosis, allergic rhinitis, hypothyroidism, abnormal CT scan of the chest, cough, vocal cord dysfunction.  Patient of Dr. 68, recently seen pulmonary nurse practitioner in April 2021.  Maintained on Advair BID, gabapentin TID, Zyrtec and Singulair. HP panel negative. ANA negative. Rheumatoid factor 24.   01/28/2020 Patient presents today for 80-month follow-up. Accompanied by her mother. She has had a chronic cough for 6 years/ gets bronchitis twice a year. She initially improved with Advair but feels recently medication does not appear to be lasting as long as it did before. She develops cough mid-afternoon. Cough is production with clear-yellow mucus. She noticed some sinus congestion with HA earlier this week . Symptoms have clear up. She takes Nasacort daily. She has arthritic pain in shoulder, hips and hands. ANA negative, RF 24.   HRCT in February 2020 showed near complete resolution findings seen on prior exam, minimal residual thickening peribronchovascular interstitium and mild periobronchovascular ground glass attenuation in the right upper lobe. Old granulomatous disease . She is open to bronchoscopy for further evaluation if needed. Confirmed she had her covid vaccine March 2021. Will check sputum culture.    Test Results: HRCT 09/04/2018 Follow up bronchitis/ pneumonia Near complete resolution of findings seen on prior studies, with minimal residual thickening of the peribronchovascular interstitium and mild peribronchovascular ground-glass attenuation in the right upper lobe near the apex. This  pattern is highly nonspecific, and categorized as alternative diagnosis to usual interstitial pneumonia (UIP) per current ATS guidelines. Mild air trapping indicative of mild small airways disease. Aortic atherosclerosis. Old granulomatous disease, as above.   Allergies  Allergen Reactions  . Levaquin [Levofloxacin In D5w] Itching    itching  . Penicillins     Serum sickness  . Sulfa Antibiotics     Hives, itching, swelling  . Aspirin     Sensitive in large doses    Immunization History  Administered Date(s) Administered  . Influenza Split 03/18/2014, 04/10/2015, 05/03/2016, 04/15/2017  . Influenza, Seasonal, Injecte, Preservative Fre 04/12/2014  . Influenza,inj,Quad PF,6+ Mos 04/22/2017, 04/20/2018, 04/04/2019  . Influenza-Unspecified [birth year], 03/27/2012, 04/10/2015, 04/16/2016, 04/22/2017  . PFIZER SARS-COV-2 Vaccination 09/22/2019, 10/13/2019  . Pneumococcal Polysaccharide-23 05/15/2004  . Tdap 03/08/2011  . Zoster 04/10/2015  . Zoster Recombinat (Shingrix) 11/19/2016, 04/20/2018    Past Medical History:  Diagnosis Date  . Arthritis    hands  . Eczema   . Hypercholesteremia   . Hypertension   . Hypothyroidism   . RLQ abdominal pain   . Seasonal allergies     Tobacco History: Social History   Tobacco Use  Smoking Status Passive Smoke Exposure - Never Smoker  Smokeless Tobacco Never Used  Tobacco Comment   exposed to secondhand smoke growing up.    Counseling given: Not Answered Comment: exposed to secondhand smoke growing up.    Outpatient Medications Prior to Visit  Medication Sig Dispense Refill  . Acetylcarnitine HCl (ACETYL L-CARNITINE) 500 MG CAPS Take 1 capsule by mouth daily.    06/20/2018 albuterol (VENTOLIN HFA) 108 (90 Base) MCG/ACT inhaler Inhale 2 puffs into the lungs every 6 (six) hours as needed for wheezing  or shortness of breath. 1 g 10  . Alpha-Lipoic Acid 200 MG CAPS Take 1 capsule by mouth daily.    Marland Kitchen b complex vitamins capsule Take 1  capsule by mouth daily.    . Calcium Carbonate-Vitamin D (CALCIUM-VITAMIN D) 500-200 MG-UNIT per tablet Take 1 tablet by mouth 2 (two) times daily.    . cetirizine (ZYRTEC) 10 MG tablet Take 10 mg by mouth daily.    . Cholecalciferol (VITAMIN D3) 2000 UNITS TABS Take 1 tablet by mouth daily.    . Coenzyme Q10 (CO Q 10) 100 MG CAPS Take 1 capsule by mouth daily.    . fluticasone-salmeterol (ADVAIR HFA) 230-21 MCG/ACT inhaler Inhale 2 puffs into the lungs 2 (two) times daily. 1 Inhaler 12  . gabapentin (NEURONTIN) 300 MG capsule TAKE 3 CAPSULES 3 TIMES DAILY 270 capsule 0  . Glucos-Chond-Hyal Ac-Ca Fructo (MOVE FREE JOINT HEALTH ADVANCE PO) Take 1 capsule by mouth daily.    . Grape Seed Extract 100 MG CAPS Take 1 capsule by mouth daily.    Marland Kitchen levothyroxine (SYNTHROID, LEVOTHROID) 25 MCG tablet Take 25 mcg by mouth daily before breakfast.    . montelukast (SINGULAIR) 10 MG tablet TAKE ONE TABLET AT BEDTIME 31 tablet 5  . Multiple Vitamin (MULTIVITAMIN WITH MINERALS) TABS tablet Take 1 tablet by mouth daily.    Marland Kitchen olopatadine (PATANOL) 0.1 % ophthalmic solution 1 drop 2 (two) times daily.    Marland Kitchen omeprazole (PRILOSEC) 40 MG capsule TAKE 1 CAPSULE EVERY DAY 31 capsule 5  . rosuvastatin (CRESTOR) 10 MG tablet Take 10 mg by mouth daily.    . traMADol (ULTRAM) 50 MG tablet Take 1 tablet (50 mg total) by mouth every 6 (six) hours as needed (cough). 45 tablet 1  . triamcinolone (NASACORT ALLERGY 24HR) 55 MCG/ACT AERO nasal inhaler Place 2 sprays into the nose daily.    . verapamil (VERELAN PM) 240 MG 24 hr capsule Take 240 mg by mouth 2 (two) times daily.    . vitamin C (ASCORBIC ACID) 500 MG tablet Take 500 mg by mouth daily.     No facility-administered medications prior to visit.    Review of Systems  Review of Systems  HENT: Positive for congestion, postnasal drip and sinus pressure.   Respiratory: Positive for cough. Negative for shortness of breath and wheezing.    Physical Exam  BP (!) 152/84  (BP Location: Left Arm, Cuff Size: Normal)   Pulse 89   Temp 97.7 F (36.5 C) (Temporal)   Ht 5\' 5"  (1.651 m)   Wt 181 lb 12.8 oz (82.5 kg)   SpO2 96%   BMI 30.25 kg/m  Physical Exam Constitutional:      Appearance: Normal appearance.  HENT:     Head: Normocephalic and atraumatic.     Mouth/Throat:     Mouth: Mucous membranes are moist.     Pharynx: Oropharynx is clear.  Cardiovascular:     Rate and Rhythm: Normal rate and regular rhythm.  Pulmonary:     Effort: Pulmonary effort is normal.     Breath sounds: Rales present.  Musculoskeletal:        General: Normal range of motion.     Comments: Arthritis changes to bilateral hands  Skin:    General: Skin is warm and dry.  Neurological:     General: No focal deficit present.     Mental Status: She is alert and oriented to person, place, and time. Mental status is at baseline.  Psychiatric:  Mood and Affect: Mood normal.        Behavior: Behavior normal.        Thought Content: Thought content normal.        Judgment: Judgment normal.      Lab Results:  CBC No results found for: WBC, RBC, HGB, HCT, PLT, MCV, MCH, MCHC, RDW, LYMPHSABS, MONOABS, EOSABS, BASOSABS  BMET No results found for: NA, K, CL, CO2, GLUCOSE, BUN, CREATININE, CALCIUM, GFRNONAA, GFRAA  BNP No results found for: BNP  ProBNP No results found for: PROBNP  Imaging: No results found.   Assessment & Plan:   Cough - Initial improvement with trial Adviar HFA. Patient reports cough returns in mid-late afternoon.  - Continue Advair hfa 230-56mcg two puffs twice daily - Continue Singulair/Zyrtec/Nasacort as prescribed - We will check a Sputum culture  - Will discuss possible evaluation by bronchoscopy with Dr. Belia Heman (sending office note and telephone encounter)   Glenford Bayley, NP 01/31/2020

## 2020-01-28 NOTE — Telephone Encounter (Signed)
Patient of yours with chronic cough. You has started her on Advair in March, she reported initial improvement but cough has returned. You had mentioned possibly getting bronchoscopy for patient. She is open to this if you feels it would be beneficial and may possibly rule out underlying condition.   In the mean time I will see if she can give sputum culture, last done in 2016.   RF 24, ANA negative

## 2020-01-28 NOTE — Patient Instructions (Signed)
-   No changes today - Recommend checking sputum sample - Will discuss bronchoscopy with Dr. Belia Heman, I have sent him a telephone note and will get back to you when I hear from him  Follow-up: - 3-4 months with Dr. Belia Heman

## 2020-01-31 NOTE — Assessment & Plan Note (Addendum)
-   Initial improvement with trial Adviar HFA. Patient reports cough returns in mid-late afternoon.  - Continue Advair hfa 230-54mcg two puffs twice daily - Continue Singulair/Zyrtec/Nasacort as prescribed - We will check a Sputum culture  - Will discuss possible evaluation by bronchoscopy with Dr. Belia Heman (sending office note and telephone encounter)

## 2020-02-17 ENCOUNTER — Other Ambulatory Visit: Payer: Self-pay | Admitting: Internal Medicine

## 2020-03-19 ENCOUNTER — Other Ambulatory Visit: Payer: Self-pay | Admitting: Internal Medicine

## 2020-04-21 ENCOUNTER — Other Ambulatory Visit: Payer: Self-pay | Admitting: Internal Medicine

## 2020-05-24 ENCOUNTER — Other Ambulatory Visit: Payer: Self-pay | Admitting: Internal Medicine

## 2020-06-12 ENCOUNTER — Other Ambulatory Visit: Payer: Self-pay | Admitting: Family Medicine

## 2020-06-12 DIAGNOSIS — Z1231 Encounter for screening mammogram for malignant neoplasm of breast: Secondary | ICD-10-CM

## 2020-06-14 ENCOUNTER — Ambulatory Visit (INDEPENDENT_AMBULATORY_CARE_PROVIDER_SITE_OTHER): Payer: 59 | Admitting: Internal Medicine

## 2020-06-14 ENCOUNTER — Encounter: Payer: Self-pay | Admitting: Internal Medicine

## 2020-06-14 ENCOUNTER — Other Ambulatory Visit: Payer: Self-pay

## 2020-06-14 VITALS — BP 128/74 | HR 80 | Temp 97.0°F | Ht 65.0 in | Wt 181.0 lb

## 2020-06-14 DIAGNOSIS — J4520 Mild intermittent asthma, uncomplicated: Secondary | ICD-10-CM | POA: Diagnosis not present

## 2020-06-14 DIAGNOSIS — R400 Somnolence: Secondary | ICD-10-CM

## 2020-06-14 DIAGNOSIS — J309 Allergic rhinitis, unspecified: Secondary | ICD-10-CM

## 2020-06-14 NOTE — Progress Notes (Signed)
Subjective:    Patient ID: Jill Crawford, female    DOB: 1955/11/16, 64 y.o.   MRN: [id number]    Synopsis: Jill Crawford was referred to the [facility] pulmonary clinic in 2016 for evaluation of cough and dyspnea.  She had a history of recurrent bronchitis and a very minimal smoking history (less than one week in college).  PFT in 2016 was normal, CXR showed bronchitis.  08/2014 CT chest (Entrikin read)> was initially read as abnormal with some inflammatory changes but this cleared up on a repeat CT chest.  CT chest 2021 No obvious abnormality  CC Follow up COUGH  HPI  Persistent chronic nonproductive cough for many years Patient has had extensive work-up with pulmonary function testing and several CT scans in the past Previous CT scan 1.5 years ago shows no obvious airways disease may be some mild interstitial lung disease Pulmonary function testing did not reveal any type of obstructive abnormality  Patient has chronic allergic rhinitis which is extensive She takes Nasacort and antihistamines with Zyrtec along with allergy eyedrops Patient has been seen in by ENT in the past  GERD seems to be under control on PPI She uses albuterol as needed She has never tried inhaled steroids  Patient is intermittently exposed to pollen and dust She lives with dogs at home She says she has a lot of dust in her house She has carpet everywhere  She has been started on Advair HFA 230 This has helped significantly as she has not used her albuterol since her last visit She has intermittent wheezing throughout the end of the day and advised to take her second dose of Advair earlier than prescribed    Patient is seen today for problems and issues with sleep related to excessive daytime sleepiness Patient  has been having sleep problems for many years Patient has been having excessive daytime sleepiness for a long time Patient has been having extreme fatigue and tiredness, lack of  energy +  very Loud snoring every night + struggling breathe at night and gasps for air   Discussed sleep data and reviewed with patient.  Encouraged proper weight management.  Discussed driving precautions and its relationship with hypersomnolence.  Discussed operating dangerous equipment and its relationship with hypersomnolence.  Discussed sleep hygiene, and benefits of a fixed sleep waked time.  The importance of getting eight or more hours of sleep discussed with patient.  Discussed limiting the use of the computer and television before bedtime.  Decrease naps during the day, so night time sleep will become enhanced.  Limit caffeine, and sleep deprivation.  HTN, stroke, and heart failure are potential risk factors.   Patient has gained a significant amount of weight over the last 2 years  EPWORTH SLEEP SCORE 10      Past Medical History:  Diagnosis Date  . Arthritis    hands  . Eczema   . Hypercholesteremia   . Hypertension   . Hypothyroidism   . RLQ abdominal pain   . Seasonal allergies      Review of Systems:  Gen:  Denies  fever, sweats, chills weight loss  HEENT: Denies blurred vision, double vision, ear pain, eye pain, hearing loss, nose bleeds, sore throat Cardiac:  No dizziness, chest pain or heaviness, chest tightness,edema, No JVD Resp:   No cough, -sputum production, -shortness of breath,-wheezing, -hemoptysis,  Gi: Denies swallowing difficulty, stomach pain, nausea or vomiting, diarrhea, constipation, bowel incontinence Gu:  Denies bladder incontinence, burning urine  Ext:   Denies Joint pain, stiffness or swelling Skin: Denies  skin rash, easy bruising or bleeding or hives Endoc:  Denies polyuria, polydipsia , polyphagia or weight change Psych:   Denies depression, insomnia or hallucinations  Other:  All other systems negative   BP 128/74 (BP Location: Left Arm, Cuff Size: Normal)   Pulse 80   Temp (!) 97 F (36.1 C) (Temporal)   Ht 5\' 5"  (1.651  m)   Wt 181 lb (82.1 kg)   SpO2 97%   BMI 30.12 kg/m    Physical Examination:   General Appearance: No distress  Neuro:without focal findings,  speech normal,  HEENT: PERRLA, EOM intact.   Pulmonary: normal breath sounds, No wheezing.  CardiovascularNormal S1,S2.  No m/r/g.   Abdomen: Benign, Soft, non-tender. Renal:  No costovertebral tenderness  GU:  Not performed at this time. Endoc: No evident thyromegaly Skin:   warm, no rashes, no ecchymosis  Extremities: normal, no cyanosis, clubbing. PSYCHIATRIC: Mood, affect within normal limits.   ALL OTHER ROS ARE NEGATIVE      PFT: October 2016 pulmonary function testing ratio 90%, FEV1 2.43 L (93% predicted) this he is, total lung capacity 4.45 L (88% predicted), DLCO 23.92 (98% predicted). April 2017 pulmonary function testing ratio 88%, FVC 2.69 L, total lung capacity 4.83 L, DLCO 21.66 (89% predicted)  Chest imaging: April 2017 high-resolution CT scan. Findings previously worrisome for NSIP have now completely resolved. No evidence of underlying pulmonary parenchymal disease. 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 Patient previously started on Advair HFA 230-She continues to take ADVAIR Patient advised to rinse mouth after use Patient advised to avoid triggers    I have reviewed previous pulmonary function testing and CT scans There are no  obvious abnormalities seen on these tests I do believe that she has asthma which is triggered by laughter dust and pollen Patient continues to do well with Advair Advair HFA 230 tenuous prescribed   Signs symptoms of daytime sleepiness Patient has signs symptoms of sleep apnea Recommend home sleep test for definitive diagnosis  Obesity -recommend significant weight loss -recommend changing diet  Deconditioned state -Recommend increased daily activity and exercise      TOTAL TIME SPENT 35 mins  COVID-19 EDUCATION: The signs and symptoms of COVID-19 were discussed with the patient and how to seek care for testing.  The importance of social distancing was discussed today. Hand Washing Techniques and avoid touching face was advised.     MEDICATION ADJUSTMENTS/LABS AND TESTS ORDERED: AVOID ALLERGENS ALBUTEROL AS NEEDED ANTIHISTAMINE  THERAPY as TAKING Continue Advair as prescribed ASSESS FOR SLEEP APNEA OBTAIN HOME SLEEP TEST   CURRENT MEDICATIONS REVIEWED AT LENGTH WITH PATIENT TODAY   Patient satisfied with Plan of action and management. All questions answered  Follow up in 6 months   Paelyn Smick Elisha Headland, M.D.  [facility]  Medical Director [place] Director [facility] Cardio-Pulmonary Department

## 2020-06-14 NOTE — Patient Instructions (Addendum)
AVOID ALLERGENS ALBUTEROL AS NEEDED ANTIHISTAMINE  THERAPY as TAKING CONTINUE ADVAIR AS PRESCRIBED ASSESS FOR SLEEP APNEA OBTAIN HOME SLEEP TEST

## 2020-06-21 ENCOUNTER — Other Ambulatory Visit: Payer: Self-pay | Admitting: Internal Medicine

## 2020-06-30 ENCOUNTER — Other Ambulatory Visit: Payer: Self-pay

## 2020-06-30 ENCOUNTER — Ambulatory Visit: Payer: 59

## 2020-06-30 DIAGNOSIS — R400 Somnolence: Secondary | ICD-10-CM

## 2020-06-30 DIAGNOSIS — J4520 Mild intermittent asthma, uncomplicated: Secondary | ICD-10-CM

## 2020-06-30 DIAGNOSIS — G4733 Obstructive sleep apnea (adult) (pediatric): Secondary | ICD-10-CM

## 2020-07-06 ENCOUNTER — Ambulatory Visit
Admission: RE | Admit: 2020-07-06 | Discharge: 2020-07-06 | Disposition: A | Discharge status: Home / Self Care | Payer: 59 | Source: Ambulatory Visit | Attending: Family Medicine | Admitting: Family Medicine

## 2020-07-06 ENCOUNTER — Other Ambulatory Visit: Payer: Self-pay

## 2020-07-06 DIAGNOSIS — Z1231 Encounter for screening mammogram for malignant neoplasm of breast: Secondary | ICD-10-CM

## 2020-07-18 ENCOUNTER — Telehealth: Payer: Self-pay | Admitting: Pulmonary Disease

## 2020-07-18 DIAGNOSIS — G4733 Obstructive sleep apnea (adult) (pediatric): Secondary | ICD-10-CM

## 2020-07-18 NOTE — Telephone Encounter (Signed)
Home sleep study from 07/01/20 showed mild obstructive sleep apnea with an AHI of 10.4 and SpO2 low of 66%.  She spent 544.6 minutes of test time with an SpO2 < 89% which is suggestive of sleep related hypoxia/hypoventilation.  Will route to Dr. Belia Heman to follow up with patient.

## 2020-07-19 NOTE — Telephone Encounter (Signed)
She should have in lab CPAP titration study.

## 2020-07-19 NOTE — Telephone Encounter (Signed)
Patient is aware of results and voiced her understanding. cpap titration has been order, as patient is agreeable.  Nothing further needed.

## 2020-07-19 NOTE — Telephone Encounter (Signed)
Will await Dr. Kasa's recommendations.  

## 2020-07-19 NOTE — Telephone Encounter (Signed)
Can you tell me what are recommendations by Dr Craige Cotta?

## 2020-07-19 NOTE — Telephone Encounter (Signed)
Pt returning missed call. [phone]

## 2020-07-19 NOTE — Telephone Encounter (Signed)
Lm for patient.  

## 2020-07-26 ENCOUNTER — Other Ambulatory Visit: Payer: Self-pay | Admitting: Internal Medicine

## 2020-08-17 ENCOUNTER — Ambulatory Visit: Payer: 59 | Attending: Pulmonary Disease

## 2020-08-17 DIAGNOSIS — G4733 Obstructive sleep apnea (adult) (pediatric): Secondary | ICD-10-CM | POA: Diagnosis present

## 2020-08-21 ENCOUNTER — Other Ambulatory Visit: Payer: Self-pay | Admitting: Internal Medicine

## 2020-08-21 ENCOUNTER — Other Ambulatory Visit: Payer: Self-pay

## 2020-08-25 ENCOUNTER — Telehealth (INDEPENDENT_AMBULATORY_CARE_PROVIDER_SITE_OTHER): Payer: 59 | Admitting: Pulmonary Disease

## 2020-08-25 DIAGNOSIS — G4733 Obstructive sleep apnea (adult) (pediatric): Secondary | ICD-10-CM | POA: Diagnosis not present

## 2020-08-25 NOTE — Telephone Encounter (Signed)
CPAP 10 cm with ultra small fullf ace mask required on titrations tudy

## 2020-09-19 NOTE — Telephone Encounter (Signed)
Patient is requesting sleep study results.  ° °Dr. Kasa, please advise. Thanks °

## 2020-09-20 ENCOUNTER — Other Ambulatory Visit: Payer: Self-pay | Admitting: Internal Medicine

## 2020-09-20 DIAGNOSIS — R059 Cough, unspecified: Secondary | ICD-10-CM

## 2020-10-04 ENCOUNTER — Other Ambulatory Visit: Payer: Self-pay | Admitting: Internal Medicine

## 2020-10-04 DIAGNOSIS — J45991 Cough variant asthma: Secondary | ICD-10-CM

## 2020-10-04 DIAGNOSIS — R059 Cough, unspecified: Secondary | ICD-10-CM

## 2020-10-22 ENCOUNTER — Other Ambulatory Visit: Payer: Self-pay | Admitting: Internal Medicine

## 2020-11-20 ENCOUNTER — Other Ambulatory Visit: Payer: Self-pay | Admitting: Internal Medicine

## 2020-12-19 ENCOUNTER — Other Ambulatory Visit: Payer: Self-pay | Admitting: Internal Medicine

## 2021-01-02 ENCOUNTER — Ambulatory Visit (INDEPENDENT_AMBULATORY_CARE_PROVIDER_SITE_OTHER): Payer: 59 | Admitting: Internal Medicine

## 2021-01-02 ENCOUNTER — Encounter: Payer: Self-pay | Admitting: Internal Medicine

## 2021-01-02 ENCOUNTER — Other Ambulatory Visit: Payer: Self-pay

## 2021-01-02 VITALS — BP 130/80 | HR 92 | Ht 64.0 in | Wt 179.2 lb

## 2021-01-02 DIAGNOSIS — G4733 Obstructive sleep apnea (adult) (pediatric): Secondary | ICD-10-CM | POA: Diagnosis not present

## 2021-01-02 DIAGNOSIS — J45991 Cough variant asthma: Secondary | ICD-10-CM | POA: Diagnosis not present

## 2021-01-02 NOTE — Patient Instructions (Signed)
Continue inhalers as prescribed  Patient will need CPAP 10 cm H2O with small fullface mask   Avoid dust and allergens

## 2021-01-02 NOTE — Progress Notes (Signed)
Subjective:    Patient ID: Jill Crawford, female    DOB: [birth year], 65 y.o.   MRN: [id number]    Synopsis: Rowyn Mustapha was referred to the [facility] pulmonary clinic in 2016 for evaluation of cough and dyspnea.  She had a history of recurrent bronchitis and a very minimal smoking history (less than one week in college).  PFT in 2016 was normal, CXR showed bronchitis.  08/2014 CT chest (Entrikin read)> was initially read as abnormal with some inflammatory changes but this cleared up on a repeat CT chest.  CT chest 2021 No obvious abnormality  PREVIOUS OV Persistent chronic nonproductive cough for many years Patient has had extensive work-up with pulmonary function testing and several CT scans in the past Previous CT scan 1.5 years ago shows no obvious airways disease may be some mild interstitial lung disease Pulmonary function testing did not reveal any type of obstructive abnormality  Patient has chronic allergic rhinitis which is extensive She takes Nasacort and antihistamines with Zyrtec along with allergy eyedrops Patient has been seen in by ENT in the past  GERD seems to be under control on PPI She uses albuterol as needed She has never tried inhaled steroids  Patient is intermittently exposed to pollen and dust She lives with dogs at home She says she has a lot of dust in her house She has carpet everywhere  on Advair HFA 230 This has helped significantly as she has not used her albuterol since her last visit She has intermittent wheezing throughout the end of the day and advised to take her second dose of Advair earlier than prescribed    CC Follow up OSA  HPI 06/2020 Home sleep study suggests AHI 10 Patient was told to undergo CPAP titration study 08/2020 Recommend 10 cm of water pressure small fullface mask  No exacerbation at this time No evidence of heart failure at this time No evidence or signs of infection at this time No respiratory  distress No fevers, chills, nausea, vomiting, diarrhea No evidence of lower extremity edema No evidence hemoptysis        Past Medical History:  Diagnosis Date   Arthritis    hands   Eczema    Hypercholesteremia    Hypertension    Hypothyroidism    RLQ abdominal pain    Seasonal allergies      Review of Systems:  Gen:  Denies  fever, sweats, chills weight loss  HEENT: Denies blurred vision, double vision, ear pain, eye pain, hearing loss, nose bleeds, sore throat Cardiac:  No dizziness, chest pain or heaviness, chest tightness,edema, No JVD Resp:   No cough, -sputum production, -shortness of breath,-wheezing, -hemoptysis,  Gi: Denies swallowing difficulty, stomach pain, nausea or vomiting, diarrhea, constipation, bowel incontinence Other:  All other systems negative    There were no vitals taken for this visit.  Physical Examination:   General Appearance: No distress  Neuro:without focal findings,  speech normal,  HEENT: PERRLA, EOM intact.   Pulmonary: normal breath sounds, No wheezing.  CardiovascularNormal S1,S2.  No m/r/g.     ALL OTHER ROS ARE NEGATIVE      PFT: October 2016 pulmonary function testing ratio 90%, FEV1 2.43 L (93% predicted) this he is, total lung capacity 4.45 L (88% predicted), DLCO 23.92 (98% predicted). April 2017 pulmonary function testing ratio 88%, FVC 2.69 L, total lung capacity 4.83 L, DLCO 21.66 (89% predicted)  Chest imaging: April 2017 high-resolution CT scan. Findings previously worrisome for NSIP  have now completely resolved. No evidence of underlying pulmonary parenchymal disease. 2018 CXR: no acute problems  CBC No results found for: WBC, RBC, HGB, HCT, PLT, MCV, MCH, MCHC, RDW, LYMPHSABS, MONOABS, EOSABS, BASOSABS  Records from her most recent visit with [place] 3 to 4 weeks ago reviewed where she was seen for chronic recurrent cough, adjustments were made to her acid reflux medicines by Elisha Headland, Elavil was started 25  mg nightly      Assessment & Plan:    65 year old pleasant white female seen today for follow-up assessment for OSA AHI was 10, CPAP titration study revealed 10 cm of water pressure with small fullface mask  Patient with underlying signs symptoms of chronic allergic rhinitis with postnasal drip with reflux with intermittent reactive airways disease exposed to dust and pollen 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   Cough variant asthma Continue Advair as prescribed Albuterol as needed  Advised to rinse mouth after every use Advised to avoid triggers  I have reviewed previous pulmonary function testing and CT scans There are no obvious abnormalities seen on these tests I do believe that she has asthma which is triggered by laughter dust and pollen Patient continues to do well with Advair Advair HFA 230  prescribed   Signs symptoms of daytime sleepiness Patient has diagnosis of sleep apnea Recommend CPAP 10 cm water pressure small fullface mask       MEDICATION ADJUSTMENTS/LABS AND TESTS ORDERED: AVOID ALLERGENS ALBUTEROL AS NEEDED ANTIHISTAMINE  THERAPY as TAKING Continue Advair as prescribed Recommend 10 cm h20 CPAP therapy small full face mask    Follow up 3 months  Total time spent 32 mins  Lakeia Bradshaw [facility], M.D.  [facility]  Medical Director [facility] Medical Director [facility] Cardio-Pulmonary Department

## 2021-01-17 ENCOUNTER — Other Ambulatory Visit: Payer: Self-pay | Admitting: Internal Medicine

## 2021-02-19 ENCOUNTER — Other Ambulatory Visit: Payer: Self-pay | Admitting: Internal Medicine

## 2021-03-20 ENCOUNTER — Other Ambulatory Visit: Payer: Self-pay | Admitting: Internal Medicine

## 2021-04-18 ENCOUNTER — Other Ambulatory Visit: Payer: Self-pay | Admitting: Internal Medicine

## 2021-04-19 NOTE — Telephone Encounter (Signed)
Jill Crawford, please advise.   Patient is requesting Rx for Gabapentin. prescribed for cough, however medication is not mentioned in last OV.  Okay to refill?

## 2021-04-20 NOTE — Telephone Encounter (Signed)
Originally prescribed by Dr. Kendrick Fries in 2020. You have refilled.

## 2021-05-22 ENCOUNTER — Other Ambulatory Visit: Payer: Self-pay | Admitting: Internal Medicine

## 2021-05-22 NOTE — Telephone Encounter (Signed)
Patient is requesting a refill on Gabapentin 300mg  TID.  This is not mentioned on last OV note.  Originally  prescribed by our office.  Dr. please advise. thanks

## 2021-05-23 NOTE — Telephone Encounter (Signed)
Originally prescribed by Dr. Kendrick Fries in 2016. You have refilled since 2021.

## 2021-05-23 NOTE — Telephone Encounter (Signed)
Appt scheduled for 05/31/2021 at 4:30. Patient is aware and voiced her understanding. Nothing further needed.

## 2021-05-31 ENCOUNTER — Other Ambulatory Visit: Payer: Self-pay

## 2021-05-31 ENCOUNTER — Encounter: Payer: Self-pay | Admitting: Internal Medicine

## 2021-05-31 ENCOUNTER — Ambulatory Visit (INDEPENDENT_AMBULATORY_CARE_PROVIDER_SITE_OTHER): Payer: 59 | Admitting: Internal Medicine

## 2021-05-31 VITALS — BP 140/80 | HR 89 | Temp 97.2°F | Ht 65.0 in | Wt 186.6 lb

## 2021-05-31 DIAGNOSIS — R1310 Dysphagia, unspecified: Secondary | ICD-10-CM | POA: Diagnosis not present

## 2021-05-31 DIAGNOSIS — J45991 Cough variant asthma: Secondary | ICD-10-CM | POA: Diagnosis not present

## 2021-05-31 DIAGNOSIS — G4733 Obstructive sleep apnea (adult) (pediatric): Secondary | ICD-10-CM

## 2021-05-31 NOTE — Progress Notes (Signed)
Subjective:    Patient ID: Jill Crawford, female    DOB: 1955/07/29, 65 y.o.   MRN: [id number]    Synopsis: Shadia Larose was referred to the [facility] pulmonary clinic in 2016 for evaluation of cough and dyspnea.  She had a history of recurrent bronchitis and a very minimal smoking history (less than one week in college).  PFT in 2016 was normal, CXR showed bronchitis.  08/2014 CT chest (Entrikin read)> was initially read as abnormal with some inflammatory changes but this cleared up on a repeat CT chest.  CT chest 2021 No obvious abnormality  PREVIOUS OV/SYNOPSIS Persistent chronic nonproductive cough for many years Patient has had extensive work-up with pulmonary function testing and several CT scans in the past Previous CT scan 1.5 years ago shows no obvious airways disease may be some mild interstitial lung disease Pulmonary function testing did not reveal any type of obstructive abnormality  Patient has chronic allergic rhinitis which is extensive She takes Nasacort and antihistamines with Zyrtec along with allergy eyedrops Patient has been seen in by ENT in the past  GERD seems to be under control on PPI She uses albuterol as needed She has never tried inhaled steroids  Patient is intermittently exposed to pollen and dust She lives with dogs at home She says she has a lot of dust in her house She has carpet everywhere  on Advair HFA 230 This has helped significantly as she has not used her albuterol since her last visit She has intermittent wheezing throughout the end of the day and advised to take her second dose of Advair earlier than prescribed    CC Follow up OSA Follow up Reactive airways disease  HPI 06/2020 Home sleep study suggest AHI of 10 CPAP compliance report reviewed in detail CPAP of 10 cm water pressure AHI reduced to 1.1 100% for days 100% for greater than 4 hours  No exacerbation at this time No evidence of heart failure at this  time No evidence or signs of infection at this time No respiratory distress No fevers, chills, nausea, vomiting, diarrhea No evidence of lower extremity edema No evidence hemoptysis  Patient using and benefiting from CPAP therapy Sleep apnea well-controlled   Patient has been started on gabapentin several years ago for chronic cough Patient is here for assessment for restarting gabapentin We have established plan of care to wean off GABAPENTIN   Past Medical History:  Diagnosis Date   Arthritis    hands   Eczema    Hypercholesteremia    Hypertension    Hypothyroidism    RLQ abdominal pain    Seasonal allergies      Review of Systems:  Gen:  Denies  fever, sweats, chills weight loss  HEENT: Denies blurred vision, double vision, ear pain, eye pain, hearing loss, nose bleeds, sore throat Cardiac:  No dizziness, chest pain or heaviness, chest tightness,edema, No JVD Resp:   + cough, -sputum production, -shortness of breath,-wheezing, -hemoptysis,  Other:  All other systems negative  BP 140/80 (BP Location: Left Arm, Patient Position: Sitting, Cuff Size: Normal)   Pulse 89   Temp (!) 97.2 F (36.2 C)   Ht 5\' 5"  (1.651 m)   Wt 186 lb 9.6 oz (84.6 kg)   SpO2 96%   BMI 31.05 kg/m    Physical Examination:   General Appearance: No distress  EYES PERRLA, EOM intact.   NECK Supple, No JVD Pulmonary: normal breath sounds, No wheezing.  CardiovascularNormal S1,S2.  No m/r/g.   ALL OTHER ROS ARE NEGATIVE    PFT: October 2016 pulmonary function testing ratio 90%, FEV1 2.43 L (93% predicted) this he is, total lung capacity 4.45 L (88% predicted), DLCO 23.92 (98% predicted). April 2017 pulmonary function testing ratio 88%, FVC 2.69 L, total lung capacity 4.83 L, DLCO 21.66 (89% predicted)  Chest imaging: April 2017 high-resolution CT scan. Findings previously worrisome for NSIP have now completely resolved. No evidence of underlying pulmonary parenchymal disease. 2018  CXR: no acute problems  CBC No results found for: WBC, RBC, HGB, HCT, PLT, MCV, MCH, MCHC, RDW, LYMPHSABS, MONOABS, EOSABS, BASOSABS  Records from her most recent visit with [place] 3 to 4 weeks ago reviewed where she was seen for chronic recurrent cough, adjustments were made to her acid reflux medicines by Elisha Headland, Elavil was started 25 mg nightly      Assessment & Plan:    65 year old pleasant white female seen today for follow-up assessment for OSA with underlying chronic allergic rhinitis with postnasal drip with reflux with intermittent reactive airways disease exposure to dust and pollen 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    Cough variant asthma Well-controlled Continue Advair Albuterol as needed Advised to rinse mouth after every use Avoid triggers  I have reviewed previous pulmonary function testing and CT scans There are no obvious abnormalities seen on these tests I do believe that she has asthma which is triggered by laughter dust and pollen Patient continues to do well with Advair Advair HFA 230  prescribed  OSA Compliance report reviewed in detail Excellent compliance Well-controlled AHI reduced to 1.1   MEDICATION ADJUSTMENTS/LABS AND TESTS ORDERED: AVOID ALLERGENS ALBUTEROL AS NEEDED Continue Advair as prescribed Recommend 10 cm h20 CPAP therapy small full face mask  GI referral for Dysphagia of liquids   GABAPENTIN DOSING  600 MG TWCIE DAILY FOR 7 days THEN 300 MG TIWCE DAILY FOR 7 DAYS THEN 300 MG ONCE DAILY FOR 7 DAYS.    Follow up 3 months  TOTAL TIME SPENT 31 mins  Wallis Bamberg [facility], M.D.  [facility]  Medical Director [facility] Medical Director [facility] Cardio-Pulmonary Department

## 2021-05-31 NOTE — Patient Instructions (Addendum)
AVOID ALLERGENS ALBUTEROL AS NEEDED Continue Advair as prescribed Recommend 10 cm h20 CPAP therapy small full face mask  GI referral for Dysphagia of liquids   GABAPENTIN DOSING  600 MG TWCIE DAILY FOR 7 days THEN 300 MG TIWCE DAILY FOR 7 DAYS THEN 300 MG ONCE DAILY FOR 7 DAYS.

## 2021-06-01 ENCOUNTER — Encounter: Payer: Self-pay | Admitting: Internal Medicine

## 2021-06-08 NOTE — Telephone Encounter (Signed)
Hi Dr Belia Heman, please advise the following:   Good afternoon, Dr. Belia Heman.   I forgot to ask you yesterday if it is normal to wake up with your eyes heavily swollen (sometimes almost shut) after sleeping with the CPAP?   For the last 3 weeks (approximate) I have woken up almost every day with swollen eyelids.   Is it okay to adjust the pressure on my machine to see if this clears up?  I am still using the same mask as I have from the beginning. Thanks Janell Quiet

## 2021-06-11 NOTE — Telephone Encounter (Signed)
Dr. Belia Heman, please see attached photos and advise. Thanks

## 2021-06-12 NOTE — Telephone Encounter (Signed)
Routing to Dr. Kasa as an FYI ?

## 2021-06-13 ENCOUNTER — Other Ambulatory Visit: Payer: Self-pay | Admitting: Family Medicine

## 2021-06-13 DIAGNOSIS — Z1231 Encounter for screening mammogram for malignant neoplasm of breast: Secondary | ICD-10-CM

## 2021-07-11 ENCOUNTER — Ambulatory Visit
Admission: RE | Admit: 2021-07-11 | Discharge: 2021-07-11 | Disposition: A | Discharge status: Home / Self Care | Payer: 59 | Source: Ambulatory Visit | Attending: Family Medicine | Admitting: Family Medicine

## 2021-07-11 ENCOUNTER — Other Ambulatory Visit: Payer: Self-pay

## 2021-07-11 DIAGNOSIS — Z1231 Encounter for screening mammogram for malignant neoplasm of breast: Secondary | ICD-10-CM | POA: Insufficient documentation

## 2021-08-09 ENCOUNTER — Ambulatory Visit: Payer: 59 | Admitting: Gastroenterology

## 2021-08-22 ENCOUNTER — Encounter: Payer: Self-pay | Admitting: Internal Medicine

## 2021-08-22 ENCOUNTER — Ambulatory Visit (INDEPENDENT_AMBULATORY_CARE_PROVIDER_SITE_OTHER): Payer: 59 | Admitting: Internal Medicine

## 2021-08-22 ENCOUNTER — Other Ambulatory Visit: Payer: Self-pay

## 2021-08-22 VITALS — BP 148/82 | HR 89 | Temp 97.3°F | Ht 65.0 in | Wt 167.6 lb

## 2021-08-22 DIAGNOSIS — R053 Chronic cough: Secondary | ICD-10-CM

## 2021-08-22 DIAGNOSIS — R1900 Intra-abdominal and pelvic swelling, mass and lump, unspecified site: Secondary | ICD-10-CM

## 2021-08-22 DIAGNOSIS — G4733 Obstructive sleep apnea (adult) (pediatric): Secondary | ICD-10-CM

## 2021-08-22 DIAGNOSIS — J45991 Cough variant asthma: Secondary | ICD-10-CM | POA: Diagnosis not present

## 2021-08-22 DIAGNOSIS — U071 COVID-19: Secondary | ICD-10-CM | POA: Diagnosis not present

## 2021-08-22 NOTE — Progress Notes (Signed)
Subjective:    Patient ID: Jill Crawford, female    DOB: 03-Mar-1956, 66 y.o.   MRN: [id number]    Synopsis: Jill Crawford was referred to the [facility] pulmonary clinic in 2016 for evaluation of cough and dyspnea.  She had a history of recurrent bronchitis and a very minimal smoking history (less than one week in college).  PFT in 2016 was normal, CXR showed bronchitis.  08/2014 CT chest ([place] read)> was initially read as abnormal with some inflammatory changes but this cleared up on a repeat CT chest.  CT chest 2021 No obvious abnormality  PREVIOUS OV/SYNOPSIS Persistent chronic nonproductive cough for many years Patient has had extensive work-up with pulmonary function testing and several CT scans in the past Previous CT scan 1.5 years ago shows no obvious airways disease may be some mild interstitial lung disease Pulmonary function testing did not reveal any type of obstructive abnormality  Patient has chronic allergic rhinitis which is extensive She takes Nasacort and antihistamines with Zyrtec along with allergy eyedrops Patient has been seen in by ENT in the past  GERD seems to be under control on PPI She uses albuterol as needed She has never tried inhaled steroids  Patient is intermittently exposed to pollen and dust She lives with dogs at home She says she has a lot of dust in her house She has carpet everywhere  on Advair HFA 230 This has helped significantly as she has not used her albuterol since her last visit She has intermittent wheezing throughout the end of the day and advised to take her second dose of Advair earlier than prescribed  COVID diagnosis 3 weeks ago 07/2021 Patient requesting gabapentin  CC Follow-up OSA Follow-up reactive airways  Follow-up chronic cough Subcutaneous mass in the belly  HPI 06/2020 Home sleep study suggest AHI of 10 CPAP compliance report reviewed in detail CPAP of 10 cm water pressure AHI reduced to 1.1 100%  for days 100% for greater than 4 hours  January 2023 CPAP download Compliance 7% for days 97% for greater than 4 hours CPAP 10 cm water pressure AHI reduced to 1  No exacerbation at this time No evidence of heart failure at this time No evidence or signs of infection at this time No respiratory distress No fevers, chills, nausea, vomiting, diarrhea No evidence of lower extremity edema No evidence hemoptysis   Patient using and benefiting from CPAP therapy Sleep apnea well-controlled  Patient diagnosed with COVID 3 weeks ago Persistent chronic cough Gabapentin was discontinued several months ago however cough returned since then Patient is requesting to restart gabapentin 300 mg 3 times a day     Past Medical History:  Diagnosis Date   Arthritis    hands   Eczema    Hypercholesteremia    Hypertension    Hypothyroidism    RLQ abdominal pain    Seasonal allergies      Review of Systems:  Gen:  Denies  fever, sweats, chills weight loss  HEENT: Denies blurred vision, double vision, ear pain, eye pain, hearing loss, nose bleeds, sore throat Cardiac:  No dizziness, chest pain or heaviness, chest tightness,edema, No JVD Resp:   + cough, -sputum production, -shortness of breath,-wheezing, -hemoptysis,  Other:  All other systems negative   BP (!) 148/82 (BP Location: Left Arm, Patient Position: Sitting, Cuff Size: Normal)    Pulse 89    Temp (!) 97.3 F (36.3 C) (Oral)    Ht 5\' 5"  (1.651 m)  Wt 167 lb 9.6 oz (76 kg)    SpO2 96%    BMI 27.89 kg/m    Physical Examination:   General Appearance: No distress  EYES PERRLA, EOM intact.   NECK Supple, No JVD Pulmonary: normal breath sounds, No wheezing.  CardiovascularNormal S1,S2.  No m/r/g.   ABD +subcutaneous mass mid stomach, painful to touch ALL OTHER ROS ARE NEGATIVE     PFT: October 2016 pulmonary function testing ratio 90%, FEV1 2.43 L (93% predicted), total lung capacity 4.45 L (88% predicted), DLCO 23.92  (98% predicted). April 2017 pulmonary function testing ratio 88%, FVC 2.69 L, total lung capacity 4.83 L, DLCO 21.66 (89% predicted)  Chest imaging: April 2017 high-resolution CT scan. Findings previously worrisome for NSIP have now completely resolved. No evidence of underlying pulmonary parenchymal disease. 2018 CXR: no acute problems  CBC No results found for: WBC, RBC, HGB, HCT, PLT, MCV, MCH, MCHC, RDW, LYMPHSABS, MONOABS, EOSABS, BASOSABS  Records from her most recent visit with [place] 3 to 4 weeks ago reviewed where she was seen for chronic recurrent cough, adjustments were made to her acid reflux medicines by Wyn Quaker, Elavil was started 25 mg nightly      Assessment & Plan:    66 year old white female seen today for follow-up assessment for OSA with underlying chronic allergic rhinitis with postnasal drip with reflux with intermittent reactive airways disease with exposure to dust and pollen likely related to cough variant asthma recent COVID-19 infection  Cough variant asthma Current cough triggered by COVID-19 infection 3 weeks ago Continue Advair Albuterol as needed Advised to rinse mouth after every use Avoid triggers  I have reviewed previous pulmonary function testing and CT scans There are no obvious abnormalities seen on these tests I do believe that she has asthma which is triggered by laughter dust and pollen Patient continues to do well with Advair Advair HFA 230  prescribed  OSA Compliance report reviewed in detail Excellent compliance Well-controlled AHI reduced to 1.1   Chronic cough GABAPENTIN DOSING 300 mg 3 times a day   Abdominal mass Possible hernia Recommend referral to general surgery   MEDICATION ADJUSTMENTS/LABS AND TESTS ORDERED: AVOID ALLERGENS Continue albuterol as Continue Zyrtec as prescribed Continue Advair HFA Continue Singulair We will restart gabapentin 300 mg 3 times a day for chronic cough We will refer to general surgery  for abdominal mass Follow-up with GI   Follow-up in 6 months  Total time spent 32 minutes  Darling Cieslewicz Patricia Pesa, M.D.  [facility]  Medical Director [place] Director [facility] Cardio-Pulmonary Department

## 2021-08-22 NOTE — Patient Instructions (Signed)
Continue albuterol as Continue Zyrtec as prescribed Continue Advair HFA Continue Singulair We will restart gabapentin 300 mg 3 times a day for chronic cough We will refer to general surgery for abdominal mass Follow-up with GI  Continue CPAP therapy as prescribed Keep up the great work A+

## 2021-08-24 ENCOUNTER — Encounter: Payer: Self-pay | Admitting: Internal Medicine

## 2021-08-24 NOTE — Telephone Encounter (Signed)
Referral was placed to general surgery on 08/22/2021.  Jill Crawford, can you help with this. It looks like the referral is still new.

## 2021-09-04 ENCOUNTER — Encounter: Payer: Self-pay | Admitting: Surgery

## 2021-09-04 ENCOUNTER — Ambulatory Visit (INDEPENDENT_AMBULATORY_CARE_PROVIDER_SITE_OTHER): Payer: 59 | Admitting: Surgery

## 2021-09-04 ENCOUNTER — Other Ambulatory Visit: Payer: Self-pay

## 2021-09-04 VITALS — BP 123/90 | HR 77 | Temp 98.3°F | Ht 65.0 in | Wt 172.0 lb

## 2021-09-04 DIAGNOSIS — R0789 Other chest pain: Secondary | ICD-10-CM

## 2021-09-04 DIAGNOSIS — M940 Chondrocostal junction syndrome [Tietze]: Secondary | ICD-10-CM

## 2021-09-04 DIAGNOSIS — S23428A Other sprain of sternum, initial encounter: Secondary | ICD-10-CM

## 2021-09-04 NOTE — Progress Notes (Signed)
Patient ID: Jill Crawford, female   DOB: February 01, 1956, 66 y.o.   MRN: [id number]  Chief Complaint: Tender epigastric density  History of Present Illness Jill Crawford is a 66 y.o. female with the above noted approximately 2 weeks after a bout with COVID had caused a lot of coughing.  The area is most noted when she is laying down.  It is tender to the touch but otherwise does not bother her.  Had some recent episode with hiccups that may have exacerbated at some but I do not believe that was the cause.  She has had a little bit of dysphagia for couple years not sure if it was related.  She has a history of upper airway cough its been ongoing over the years.  Past Medical History Past Medical History:  Diagnosis Date   Arthritis    hands   Eczema    Hypercholesteremia    Hypertension    Hypothyroidism    RLQ abdominal pain    Seasonal allergies       Past Surgical History:  Procedure Laterality Date   CARPAL TUNNEL RELEASE     COLONOSCOPY WITH PROPOFOL N/A 03/20/2018   Procedure: COLONOSCOPY WITH PROPOFOL;  Surgeon: Scot Jun, MD;  Location: [facility] ENDOSCOPY;  Service: Endoscopy;  Laterality: N/A;   history of adenomatous polyp of colon     neuroplasty &/or transposition median     THYROID SURGERY     TONSILLECTOMY      Allergies  Allergen Reactions   Levaquin [Levofloxacin In D5w] Itching    itching   Penicillins     Serum sickness   Sulfa Antibiotics     Hives, itching, swelling   Aspirin     Sensitive in large doses    Current Outpatient Medications  Medication Sig Dispense Refill   Acetylcarnitine HCl (ACETYL L-CARNITINE) 500 MG CAPS Take 1 capsule by mouth daily.     ADVAIR HFA 230-21 MCG/ACT inhaler INHALE 2 PUFFS INTO THE LUNGS TWICE DAILY AS DIRECTED 12 g 12   Alpha-Lipoic Acid 200 MG CAPS Take 1 capsule by mouth daily.     b complex vitamins capsule Take 1 capsule by mouth daily.     Calcium Carbonate-Vitamin D (CALCIUM-VITAMIN D) 500-200  MG-UNIT per tablet Take 1 tablet by mouth 2 (two) times daily.     cetirizine (ZYRTEC) 10 MG tablet Take 10 mg by mouth daily.     Cholecalciferol (VITAMIN D3) 2000 UNITS TABS Take 1 tablet by mouth daily.     Coenzyme Q10 (CO Q 10) 100 MG CAPS Take 1 capsule by mouth daily.     gabapentin (NEURONTIN) 300 MG capsule TAKE 3 CAPSULES BY MOUTH 3 TIMES DAILY 270 capsule 0   Glucos-Chond-Hyal Ac-Ca Fructo (MOVE FREE JOINT HEALTH ADVANCE PO) Take 1 capsule by mouth daily.     Grape Seed Extract 100 MG CAPS Take 1 capsule by mouth daily.     levothyroxine (SYNTHROID, LEVOTHROID) 25 MCG tablet Take 25 mcg by mouth daily before breakfast.     montelukast (SINGULAIR) 10 MG tablet TAKE ONE TABLET AT BEDTIME 31 tablet 5   Multiple Vitamin (MULTIVITAMIN WITH MINERALS) TABS tablet Take 1 tablet by mouth daily.     olopatadine (PATANOL) 0.1 % ophthalmic solution 1 drop 2 (two) times daily.     omeprazole (PRILOSEC) 40 MG capsule TAKE 1 CAPSULE EVERY DAY 31 capsule 5   rosuvastatin (CRESTOR) 10 MG tablet Take 10 mg by mouth daily.  traMADol (ULTRAM) 50 MG tablet Take 1 tablet (50 mg total) by mouth every 6 (six) hours as needed (cough). 45 tablet 1   triamcinolone (NASACORT) 55 MCG/ACT AERO nasal inhaler Place 2 sprays into the nose daily.     VENTOLIN HFA 108 (90 Base) MCG/ACT inhaler INHALE 2 PUFFS INTO THE LUNGS EVERY SIX HOURS AS NEEDED FOR WHEEZING OR SHORTNESS OF BREATH 18 g 3   verapamil (VERELAN PM) 240 MG 24 hr capsule Take 240 mg by mouth 2 (two) times daily.     vitamin C (ASCORBIC ACID) 500 MG tablet Take 500 mg by mouth daily.     No current facility-administered medications for this visit.    Family History Family History  Problem Relation Age of Onset   Hypertension Mother    Heart disease Father    Colon cancer Father    Hypertension Brother    Breast cancer Neg Hx       Social History Social History   Tobacco Use   Smoking status: Never    Passive exposure: Past   Smokeless  tobacco: Never   Tobacco comments:    exposed to secondhand smoke growing up.   Vaping Use   Vaping Use: Never used  Substance Use Topics   Alcohol use: No    Alcohol/week: 0.0 standard drinks   Drug use: No       Review of Systems  Constitutional: Negative.   HENT:  Positive for tinnitus.   Eyes: Negative.   Respiratory:  Positive for cough.   Cardiovascular:  Positive for leg swelling.  Gastrointestinal: Negative.   Genitourinary: Negative.   Skin: Negative.   Neurological: Negative.   Psychiatric/Behavioral: Negative.       Physical Exam Blood pressure 123/90, pulse 77, temperature 98.3 F (36.8 C), height 5\' 5"  (1.651 m), weight 172 lb (78 kg), SpO2 97 %. Last Weight  Most recent update: 09/04/2021  3:45 PM    Weight  78 kg (172 lb)             CONSTITUTIONAL: Well developed, and nourished, appropriately responsive and aware without distress.   EYES: Sclera non-icteric.   EARS, NOSE, MOUTH AND THROAT: Mask worn.   The oropharynx is clear. Oral mucosa is pink and moist.   Hearing is intact to voice.  NECK: Trachea is midline, and there is no jugular venous distension.  LYMPH NODES:  Lymph nodes in the neck are not enlarged. RESPIRATORY:  Lungs are clear, and breath sounds are equal bilaterally. Normal respiratory effort without pathologic use of accessory muscles. CARDIOVASCULAR: Heart is regular in rate and rhythm. GI: In the epigastrium at the area of the xyphoid is a firm mass in apparent continuity with the junction of the costal margins at the sternum.  It is tender to touch.  No bulge, no fascial defect.  The abdomen is  soft, nontender, and nondistended. There were no palpable masses. I did not appreciate hepatosplenomegaly. There were normal bowel sounds. MUSCULOSKELETAL:  Symmetrical muscle tone appreciated in all four extremities.    SKIN: Skin turgor is normal. No pathologic skin lesions appreciated.  NEUROLOGIC:  Motor and sensation appear grossly normal.   Cranial nerves are grossly without defect. PSYCH:  Alert and oriented to person, place and time. Affect is appropriate for situation.  Data Reviewed I have personally reviewed what is currently available of the patient's imaging, recent labs and medical records.   Labs:  No flowsheet data found. No flowsheet data found.  Imaging:  Within last 24 hrs: No results found.  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 Patient Active Problem List   Diagnosis Date Noted   Allergic rhinitis 08/08/2017   Aortic atherosclerosis (HCC) 11/19/2016   Abdominal pain, RLQ (right lower quadrant) 10/01/2016   History of adenomatous polyp of colon 10/01/2016   Left carpal tunnel syndrome 08/30/2016   Trigger middle finger of right hand 08/30/2016   Acute bronchitis 07/14/2015   Vocal cord dysfunction 04/27/2015   Cough 08/18/2014   Abnormal CT scan, chest 08/18/2014   Hypothyroidism 04/09/2013   Right lateral epicondylitis 09/30/2012   Essential hypertension 03/01/2011   Pure hypercholesterolemia 03/01/2011    Plan    We discussed the use of OTC analgesics for discomfort.  But at present she has no pain, just some tenderness with direct palpation.  I think this is likely of focal inflammatory process secondary to her recent episode of exacerbated coughing.  I believe this can be monitored closely, follow-up in a month or 2 as needed.  We discussed various imaging modalities but I suspect none of them to be terribly helpful.  Be glad to see this pleasant patient again if I can be any further assistance.  Face-to-face time spent with the patient and accompanying care providers(if present) was 45 minutes, with more than 50% of the time spent counseling, educating, and coordinating care of the patient.    These notes generated with voice recognition software. I apologize for typographical errors.  Campbell Lerner M.D., FACS 09/05/2021, 2:56 PM

## 2021-09-04 NOTE — Patient Instructions (Addendum)
Please call our office if you have any questions or concerns. You may try some ibuprofen for the next few days or when it is bothersome.

## 2021-09-05 DIAGNOSIS — R0789 Other chest pain: Secondary | ICD-10-CM | POA: Insufficient documentation

## 2021-09-13 ENCOUNTER — Ambulatory Visit (INDEPENDENT_AMBULATORY_CARE_PROVIDER_SITE_OTHER): Payer: 59 | Admitting: Gastroenterology

## 2021-09-13 ENCOUNTER — Other Ambulatory Visit: Payer: Self-pay

## 2021-09-13 VITALS — BP 144/80 | HR 82 | Temp 97.9°F | Wt 178.4 lb

## 2021-09-13 DIAGNOSIS — R1319 Other dysphagia: Secondary | ICD-10-CM | POA: Diagnosis not present

## 2021-09-13 MED ORDER — PANTOPRAZOLE SODIUM 40 MG PO TBEC: 40.0000 mg | DELAYED_RELEASE_TABLET | Freq: Every day | ORAL | 3 refills | Status: DC

## 2021-09-13 NOTE — Addendum Note (Signed)
Addended by: Roena Malady on: 09/13/2021 04:19 PM ? ? Modules accepted: Orders ? ?

## 2021-09-13 NOTE — Progress Notes (Signed)
? ? ?Gastroenterology Consultation ? ?Referring Provider:     Leim FabryAldridge, Barbara, MD ?Primary Care Physician:  Leim FabryAldridge, Barbara, MD ?Primary Gastroenterologist:  Dr. Servando SnareWohl     ?Reason for Consultation:     Dysphagia ?      ? HPI:   ?Jill SableSusan Denise Crawford is a 66 y.o. y/o female referred for consultation & management of dysphagia by Dr. Leim FabryAldridge, Barbara, MD. This patient has a history of GERD and was reported to have dysphagia.  The patient was seen by pulmonology and recommended to come see me.  The patient had previously been seen by Dr. Mechele CollinElliott with her last colonoscopy in 2019 with a report at that time stated that her colon was normal and that she would need a repeat colonoscopy in 10 years. She has had a chronic cough for 7 and a half years. She was taken off Gabapentin and her cough was worse. Food hangs in her distal esophagus. She can have reflux at times but not often.  ?The patient reports that her main concern is the cough.  She now takes Prilosec when she gets up in the morning and continues to have the cough.  She denies any nausea vomiting but does report that she has dysphagia.  The dysphagia is mainly to bread and she feels that it gets stuck in her lower esophagus.  The patient has no unexplained weight loss fevers chills nausea or vomiting. ? ?Past Medical History:  ?Diagnosis Date  ? Arthritis   ? hands  ? Eczema   ? Hypercholesteremia   ? Hypertension   ? Hypothyroidism   ? RLQ abdominal pain   ? Seasonal allergies   ? ? ?Past Surgical History:  ?Procedure Laterality Date  ? CARPAL TUNNEL RELEASE    ? COLONOSCOPY WITH PROPOFOL N/A 03/20/2018  ? Procedure: COLONOSCOPY WITH PROPOFOL;  Surgeon: Scot JunElliott, Robert T, MD;  [facility] END[facility]COPY;  Service: Endoscopy;  Laterality: N/A;  ? history of adenomatous polyp of colon    ? neuroplasty &/or transposition median    ? THYROID SURGERY    ? TONSILLECTOMY    ? ? ?Prior to Admission medications   ?Medication Sig Start Date End Date Taking? Authorizing  Provider  ?Acetylcarnitine HCl (ACETYL L-CARNITINE) 500 MG CAPS Take 1 capsule by mouth daily.    [provider]  ?ADVAIR HFA 230-21 MCG/ACT inhaler INHALE 2 PUFFS INTO THE LUNGS TWICE DAILY AS DIRECTED 09/20/20   Erin FullingKasa, Kurian, MD  ?Alpha-Lipoic Acid 200 MG CAPS Take 1 capsule by mouth daily.    [provider]  ?b complex vitamins capsule Take 1 capsule by mouth daily.    [provider]  ?Calcium Carbonate-Vitamin D (CALCIUM-VITAMIN D) 500-200 MG-UNIT per tablet Take 1 tablet by mouth 2 (two) times daily.    [provider]  ?cetirizine (ZYRTEC) 10 MG tablet Take 10 mg by mouth daily.    [provider]  ?Cholecalciferol (VITAMIN D3) 2000 UNITS TABS Take 1 tablet by mouth daily.    [provider]  ?Coenzyme Q10 (CO Q 10) 100 MG CAPS Take 1 capsule by mouth daily.    [provider]  ?gabapentin (NEURONTIN) 300 MG capsule TAKE 3 CAPSULES BY MOUTH 3 TIMES DAILY 04/23/21   Erin FullingKasa, Kurian, MD  ?Glucos-Chond-Hyal Ac-Ca Fructo (MOVE FREE JOINT HEALTH ADVANCE PO) Take 1 capsule by mouth daily.    [provider]  ?Grape Seed Extract 100 MG CAPS Take 1 capsule by mouth daily.    [provider]  ?  levothyroxine (SYNTHROID, LEVOTHROID) 25 MCG tablet Take 25 mcg by mouth daily before breakfast.    [provider]  ?montelukast (SINGULAIR) 10 MG tablet TAKE ONE TABLET AT BEDTIME 03/21/21   Erin Fulling, MD  ?Multiple Vitamin (MULTIVITAMIN WITH MINERALS) TABS tablet Take 1 tablet by mouth daily.    [provider]  ?olopatadine (PATANOL) 0.1 % ophthalmic solution 1 drop 2 (two) times daily.    [provider]  ?omeprazole (PRILOSEC) 40 MG capsule TAKE 1 CAPSULE EVERY DAY 03/21/21   Erin Fulling, MD  ?rosuvastatin (CRESTOR) 10 MG tablet Take 10 mg by mouth daily.    [provider]  ?traMADol (ULTRAM) 50 MG tablet Take 1 tablet (50 mg total) by mouth every 6 (six) hours as needed (cough). 07/19/16   Lupita Leash,  MD  ?triamcinolone (NASACORT) 55 MCG/ACT AERO nasal inhaler Place 2 sprays into the nose daily.    [provider]  ?VENTOLIN HFA 108 (90 Base) MCG/ACT inhaler INHALE 2 PUFFS INTO THE LUNGS EVERY SIX HOURS AS NEEDED FOR WHEEZING OR SHORTNESS OF BREATH 10/04/20   Erin Fulling, MD  ?verapamil (VERELAN PM) 240 MG 24 hr capsule Take 240 mg by mouth 2 (two) times daily.    [provider]  ?vitamin C (ASCORBIC ACID) 500 MG tablet Take 500 mg by mouth daily.    [provider]  ? ? ?Family History  ?Problem Relation Age of Onset  ? Hypertension Mother   ? Heart disease Father   ? Colon cancer Father   ? Hypertension Brother   ? Breast cancer Neg Hx   ?  ? ?Social History  ? ?Tobacco Use  ? Smoking status: Never  ?  Passive exposure: Past  ? Smokeless tobacco: Never  ? Tobacco comments:  ?  exposed to secondhand smoke growing up.   ?Vaping Use  ? Vaping Use: Never used  ?Substance Use Topics  ? Alcohol use: No  ?  Alcohol/week: 0.0 standard drinks  ? Drug use: No  ? ? ?Allergies as of 09/13/2021 - Review Complete 09/04/2021  ?Allergen Reaction Noted  ? Levaquin [levofloxacin in d5w] Itching 08/18/2014  ? Penicillins  08/18/2014  ? Sulfa antibiotics  08/18/2014  ? Aspirin  08/18/2014  ? ? ?Review of Systems:    ?All systems reviewed and negative except where noted in HPI. ? ? Physical Exam:  ?There were no vitals taken for this visit. ?No LMP recorded. Patient is postmenopausal. ?General:   Alert,  Well-developed, well-nourished, pleasant and cooperative in NAD ?Head:  Normocephalic and atraumatic. ?Eyes:  Sclera clear, no icterus.   Conjunctiva pink. ?Ears:  Normal auditory acuity. ?Neck:  Supple; no masses or thyromegaly. ?Lungs:  Respirations even and unlabored.  Clear throughout to auscultation.   No wheezes, crackles, or rhonchi. No acute distress. ?Heart:  Regular rate and rhythm; no murmurs, clicks, rubs, or gallops. ?Abdomen:  Normal bowel sounds.  No bruits.  Soft, non-tender and  non-distended without masses, hepatosplenomegaly or hernias noted.  No guarding or rebound tenderness.  Negative Carnett sign.   ?Rectal:  Deferred.  ?Pulses:  Normal pulses noted. ?Extremities:  No clubbing or edema.  No cyanosis. ?Neurologic:  Alert and oriented x3;  grossly normal neurologically. ?Skin:  Intact without significant lesions or rashes.  No jaundice. ?Lymph Nodes:  No significant cervical adenopathy. ?Psych:  Alert and cooperative. Normal mood and affect. ? ?Imaging Studies: ?No results found. ? ?Assessment and Plan:  ? ?Jill Crawford is a 66  y.o. y/o female who comes in today with a history of dysphagia and cough.  The patient was evaluated by pulmonology and ENT and was recommended to undergo an EGD.  The patient will also have her Prilosec switched to pantoprazole to see if that helps her symptoms since she continues to have a cough despite being on a PPI.  The patient has been told that the other possibility is that the cough is not caused by acid reflux.  The patient will follow up at the time of the upper endoscopy and let me know at that time if the pantoprazole has made any difference.  The patient has been explained the plan and agrees with it. ? ? ? ?Midge Minium, MD. Clementeen Graham ? ? ? Note: This dictation was prepared with Dragon dictation along with smaller phrase technology. Any transcriptional errors that result from this process are unintentional.   ?

## 2021-09-13 NOTE — H&P (View-Only) (Signed)
? ? ?Gastroenterology Consultation ? ?Referring Provider:     Leim Fabry, MD ?Primary Care Physician:  Leim Fabry, MD ?Primary Gastroenterologist:  Dr. Servando Snare     ?Reason for Consultation:     Dysphagia ?      ? HPI:   ?Jill Crawford is a 66 y.o. y/o female referred for consultation & management of dysphagia by Dr. Leim Fabry, MD. This patient has a history of GERD and was reported to have dysphagia.  The patient was seen by pulmonology and recommended to come see me.  The patient had previously been seen by Dr. Mechele Collin with her last colonoscopy in 2019 with a report at that time stated that her colon was normal and that she would need a repeat colonoscopy in 10 years. She has had a chronic cough for 7 and a half years. She was taken off Gabapentin and her cough was worse. Food hangs in her distal esophagus. She can have reflux at times but not often.  ?The patient reports that her main concern is the cough.  She now takes Prilosec when she gets up in the morning and continues to have the cough.  She denies any nausea vomiting but does report that she has dysphagia.  The dysphagia is mainly to bread and she feels that it gets stuck in her lower esophagus.  The patient has no unexplained weight loss fevers chills nausea or vomiting. ? ?Past Medical History:  ?Diagnosis Date  ? Arthritis   ? hands  ? Eczema   ? Hypercholesteremia   ? Hypertension   ? Hypothyroidism   ? RLQ abdominal pain   ? Seasonal allergies   ? ? ?Past Surgical History:  ?Procedure Laterality Date  ? CARPAL TUNNEL RELEASE    ? COLONOSCOPY WITH PROPOFOL N/A 03/20/2018  ? Procedure: COLONOSCOPY WITH PROPOFOL;  Surgeon: Scot Jun, MD;  Location: [facility] ENDOSCOPY;  Service: Endoscopy;  Laterality: N/A;  ? history of adenomatous polyp of colon    ? neuroplasty &/or transposition median    ? THYROID SURGERY    ? TONSILLECTOMY    ? ? ?Prior to Admission medications   ?Medication Sig Start Date End Date Taking? Authorizing  Provider  ?Acetylcarnitine HCl (ACETYL L-CARNITINE) 500 MG CAPS Take 1 capsule by mouth daily.    [provider]  ?ADVAIR HFA 230-21 MCG/ACT inhaler INHALE 2 PUFFS INTO THE LUNGS TWICE DAILY AS DIRECTED 09/20/20   Erin Fulling, MD  ?Alpha-Lipoic Acid 200 MG CAPS Take 1 capsule by mouth daily.    [provider]  ?b complex vitamins capsule Take 1 capsule by mouth daily.    [provider]  ?Calcium Carbonate-Vitamin D (CALCIUM-VITAMIN D) 500-200 MG-UNIT per tablet Take 1 tablet by mouth 2 (two) times daily.    [provider]  ?cetirizine (ZYRTEC) 10 MG tablet Take 10 mg by mouth daily.    [provider]  ?Cholecalciferol (VITAMIN D3) 2000 UNITS TABS Take 1 tablet by mouth daily.    [provider]  ?Coenzyme Q10 (CO Q 10) 100 MG CAPS Take 1 capsule by mouth daily.    [provider]  ?gabapentin (NEURONTIN) 300 MG capsule TAKE 3 CAPSULES BY MOUTH 3 TIMES DAILY 04/23/21   Erin Fulling, MD  ?Glucos-Chond-Hyal Ac-Ca Fructo (MOVE FREE JOINT HEALTH ADVANCE PO) Take 1 capsule by mouth daily.    [provider]  ?Grape Seed Extract 100 MG CAPS Take 1 capsule by mouth daily.    [provider]  ?  levothyroxine (SYNTHROID, LEVOTHROID) 25 MCG tablet Take 25 mcg by mouth daily before breakfast.    [provider]  ?montelukast (SINGULAIR) 10 MG tablet TAKE ONE TABLET AT BEDTIME 03/21/21   Erin Fulling, MD  ?Multiple Vitamin (MULTIVITAMIN WITH MINERALS) TABS tablet Take 1 tablet by mouth daily.    [provider]  ?olopatadine (PATANOL) 0.1 % ophthalmic solution 1 drop 2 (two) times daily.    [provider]  ?omeprazole (PRILOSEC) 40 MG capsule TAKE 1 CAPSULE EVERY DAY 03/21/21   Erin Fulling, MD  ?rosuvastatin (CRESTOR) 10 MG tablet Take 10 mg by mouth daily.    [provider]  ?traMADol (ULTRAM) 50 MG tablet Take 1 tablet (50 mg total) by mouth every 6 (six) hours as needed (cough). 07/19/16   Lupita Leash,  MD  ?triamcinolone (NASACORT) 55 MCG/ACT AERO nasal inhaler Place 2 sprays into the nose daily.    [provider]  ?VENTOLIN HFA 108 (90 Base) MCG/ACT inhaler INHALE 2 PUFFS INTO THE LUNGS EVERY SIX HOURS AS NEEDED FOR WHEEZING OR SHORTNESS OF BREATH 10/04/20   Erin Fulling, MD  ?verapamil (VERELAN PM) 240 MG 24 hr capsule Take 240 mg by mouth 2 (two) times daily.    [provider]  ?vitamin C (ASCORBIC ACID) 500 MG tablet Take 500 mg by mouth daily.    [provider]  ? ? ?Family History  ?Problem Relation Age of Onset  ? Hypertension Mother   ? Heart disease Father   ? Colon cancer Father   ? Hypertension Brother   ? Breast cancer Neg Hx   ?  ? ?Social History  ? ?Tobacco Use  ? Smoking status: Never  ?  Passive exposure: Past  ? Smokeless tobacco: Never  ? Tobacco comments:  ?  exposed to secondhand smoke growing up.   ?Vaping Use  ? Vaping Use: Never used  ?Substance Use Topics  ? Alcohol use: No  ?  Alcohol/week: 0.0 standard drinks  ? Drug use: No  ? ? ?Allergies as of 09/13/2021 - Review Complete 09/04/2021  ?Allergen Reaction Noted  ? Levaquin [levofloxacin in d5w] Itching 08/18/2014  ? Penicillins  08/18/2014  ? Sulfa antibiotics  08/18/2014  ? Aspirin  08/18/2014  ? ? ?Review of Systems:    ?All systems reviewed and negative except where noted in HPI. ? ? Physical Exam:  ?There were no vitals taken for this visit. ?No LMP recorded. Patient is postmenopausal. ?General:   Alert,  Well-developed, well-nourished, pleasant and cooperative in NAD ?Head:  Normocephalic and atraumatic. ?Eyes:  Sclera clear, no icterus.   Conjunctiva pink. ?Ears:  Normal auditory acuity. ?Neck:  Supple; no masses or thyromegaly. ?Lungs:  Respirations even and unlabored.  Clear throughout to auscultation.   No wheezes, crackles, or rhonchi. No acute distress. ?Heart:  Regular rate and rhythm; no murmurs, clicks, rubs, or gallops. ?Abdomen:  Normal bowel sounds.  No bruits.  Soft, non-tender and  non-distended without masses, hepatosplenomegaly or hernias noted.  No guarding or rebound tenderness.  Negative Carnett sign.   ?Rectal:  Deferred.  ?Pulses:  Normal pulses noted. ?Extremities:  No clubbing or edema.  No cyanosis. ?Neurologic:  Alert and oriented x3;  grossly normal neurologically. ?Skin:  Intact without significant lesions or rashes.  No jaundice. ?Lymph Nodes:  No significant cervical adenopathy. ?Psych:  Alert and cooperative. Normal mood and affect. ? ?Imaging Studies: ?No results found. ? ?Assessment and Plan:  ? ?Amely Voorheis is a 66  y.o. y/o female who comes in today with a history of dysphagia and cough.  The patient was evaluated by pulmonology and ENT and was recommended to undergo an EGD.  The patient will also have her Prilosec switched to pantoprazole to see if that helps her symptoms since she continues to have a cough despite being on a PPI.  The patient has been told that the other possibility is that the cough is not caused by acid reflux.  The patient will follow up at the time of the upper endoscopy and let me know at that time if the pantoprazole has made any difference.  The patient has been explained the plan and agrees with it. ? ? ? ?Midge Minium, MD. Clementeen Graham ? ? ? Note: This dictation was prepared with Dragon dictation along with smaller phrase technology. Any transcriptional errors that result from this process are unintentional.   ?

## 2021-09-17 ENCOUNTER — Other Ambulatory Visit: Payer: Self-pay | Admitting: Internal Medicine

## 2021-09-18 NOTE — Telephone Encounter (Signed)
Patient is requesting a refill on prilosec. This medication is not mentioned in last office note.  ?

## 2021-09-26 ENCOUNTER — Encounter: Payer: Self-pay | Admitting: Gastroenterology

## 2021-09-27 ENCOUNTER — Encounter: Payer: Self-pay | Admitting: Gastroenterology

## 2021-09-27 ENCOUNTER — Ambulatory Visit: Payer: 59 | Admitting: Anesthesiology

## 2021-09-27 ENCOUNTER — Ambulatory Visit
Admission: RE | Admit: 2021-09-27 | Discharge: 2021-09-27 | Disposition: A | Discharge status: Home / Self Care | Payer: 59 | Attending: Gastroenterology | Admitting: Gastroenterology

## 2021-09-27 ENCOUNTER — Encounter
Admission: RE | Disposition: A | Discharge status: Home / Self Care | Payer: Self-pay | Source: Home / Self Care | Attending: Gastroenterology

## 2021-09-27 DIAGNOSIS — K449 Diaphragmatic hernia without obstruction or gangrene: Secondary | ICD-10-CM | POA: Diagnosis not present

## 2021-09-27 DIAGNOSIS — R1319 Other dysphagia: Secondary | ICD-10-CM | POA: Diagnosis not present

## 2021-09-27 DIAGNOSIS — E7800 Pure hypercholesterolemia, unspecified: Secondary | ICD-10-CM | POA: Diagnosis not present

## 2021-09-27 DIAGNOSIS — I10 Essential (primary) hypertension: Secondary | ICD-10-CM | POA: Insufficient documentation

## 2021-09-27 DIAGNOSIS — K219 Gastro-esophageal reflux disease without esophagitis: Secondary | ICD-10-CM | POA: Diagnosis not present

## 2021-09-27 DIAGNOSIS — Z86010 Personal history of colonic polyps: Secondary | ICD-10-CM | POA: Diagnosis not present

## 2021-09-27 DIAGNOSIS — R1310 Dysphagia, unspecified: Secondary | ICD-10-CM | POA: Diagnosis present

## 2021-09-27 DIAGNOSIS — E039 Hypothyroidism, unspecified: Secondary | ICD-10-CM | POA: Diagnosis not present

## 2021-09-27 DIAGNOSIS — Z7722 Contact with and (suspected) exposure to environmental tobacco smoke (acute) (chronic): Secondary | ICD-10-CM | POA: Insufficient documentation

## 2021-09-27 HISTORY — PX: ESOPHAGOGASTRODUODENOSCOPY (EGD) WITH PROPOFOL: SHX5813

## 2021-09-27 SURGERY — ESOPHAGOGASTRODUODENOSCOPY (EGD) WITH PROPOFOL
Anesthesia: General

## 2021-09-27 MED ORDER — LIDOCAINE HCL (CARDIAC) PF 100 MG/5ML IV SOSY
PREFILLED_SYRINGE | INTRAVENOUS | Status: DC | PRN
  Administered 2021-09-27: 50 mg via INTRAVENOUS

## 2021-09-27 MED ORDER — PROPOFOL 500 MG/50ML IV EMUL
INTRAVENOUS | Status: DC | PRN
  Administered 2021-09-27: 150 ug/kg/min via INTRAVENOUS

## 2021-09-27 MED ORDER — PROPOFOL 10 MG/ML IV BOLUS
INTRAVENOUS | Status: DC | PRN
  Administered 2021-09-27: 80 mg via INTRAVENOUS

## 2021-09-27 MED ORDER — SODIUM CHLORIDE 0.9 % IV SOLN: INTRAVENOUS | Status: DC

## 2021-09-27 NOTE — Anesthesia Preprocedure Evaluation (Signed)
Anesthesia Evaluation  ?Patient identified by MRN, date of birth, ID band ?Patient awake ? ? ? ?Reviewed: ?Allergy & Precautions, H&P , NPO status , Patient's Chart, lab work & pertinent test results, reviewed documented beta blocker date and time  ? ?Airway ?Mallampati: II ? ? ?Neck ROM: full ? ? ? Dental ? ?(+) Poor Dentition ?  ?Pulmonary ?neg pulmonary ROS,  ?  ?Pulmonary exam normal ? ? ? ? ? ? ? Cardiovascular ?Exercise Tolerance: Good ?hypertension, On Medications ?negative cardio ROS ?Normal cardiovascular exam ?Rhythm:regular Rate:Normal ? ? ?  ?Neuro/Psych ? Neuromuscular disease negative psych ROS  ? GI/Hepatic ?negative GI ROS, Neg liver ROS,   ?Endo/Other  ?Hypothyroidism  ? Renal/GU ?negative Renal ROS  ?negative genitourinary ?  ?Musculoskeletal ? ? Abdominal ?  ?Peds ? Hematology ?negative hematology ROS ?(+)   ?Anesthesia Other Findings ?Past Medical History: ?No date: Arthritis ?    Comment:  hands ?No date: Eczema ?No date: Hypercholesteremia ?No date: Hypertension ?No date: Hypothyroidism ?No date: Seasonal allergies ?Past Surgical History: ?No date: CARPAL TUNNEL RELEASE ?03/20/2018: COLONOSCOPY WITH PROPOFOL; N/A ?    Comment:  Procedure: COLONOSCOPY WITH PROPOFOL;  Surgeon: Mechele Collin, ?             Wilber Bihari, MD;  Location: [facility] ENDOSCOPY;  Service:  ?             Endoscopy;  Laterality: N/A; ?No date: history of adenomatous polyp of colon ?No date: neuroplasty &/or transposition median ?No date: THYROID SURGERY ?No date: TONSILLECTOMY ?BMI   ? Body Mass Index: 28.62 kg/m?  ?  ? Reproductive/Obstetrics ?negative OB ROS ? ?  ? ? ? ? ? ? ? ? ? ? ? ? ? ?  ?  ? ? ? ? ? ? ? ? ?Anesthesia Physical ?Anesthesia Plan ? ?ASA: 2 ? ?Anesthesia Plan: General  ? ?Post-op Pain Management:   ? ?Induction:  ? ?PONV Risk Score and Plan:  ? ?Airway Management Planned:  ? ?Additional Equipment:  ? ?Intra-op Plan:  ? ?Post-operative Plan:  ? ?Informed Consent: I have reviewed the  patients History and Physical, chart, labs and discussed the procedure including the risks, benefits and alternatives for the proposed anesthesia with the patient or authorized representative who has indicated his/her understanding and acceptance.  ? ? ? ?Dental Advisory Given ? ?Plan Discussed with: CRNA ? ?Anesthesia Plan Comments:   ? ? ? ? ? ? ?Anesthesia Quick Evaluation ? ?

## 2021-09-27 NOTE — Op Note (Signed)
[facility] ?Gastroenterology ?Patient Name: Jill Crawford ?Procedure Date: 09/27/2021 11:36 AM ?MRN: [id number] ?Account #: [id number] ?Date of Birth: 02-15-1956 ?Admit Type: Outpatient ?Age: 66 ?Room: [facility] ENDO ROOM 3 ?Gender: Female ?Note Status: Finalized ?Instrument Name: Upper Endoscope [id number] ?Procedure:             Upper GI endoscopy ?Indications:           Dysphagia ?Providers:             Midge Minium MD, MD ?Referring MD:          Leim Fabry MD, MD (Referring MD) ?Medicines:             Propofol per Anesthesia ?Complications:         No immediate complications. ?Procedure:             Pre-Anesthesia Assessment: ?                       - Prior to the procedure, a History and Physical was  ?                       performed, and patient medications and allergies were  ?                       reviewed. The patient's tolerance of previous  ?                       anesthesia was also reviewed. The risks and benefits  ?                       of the procedure and the sedation options and risks  ?                       were discussed with the patient. All questions were  ?                       answered, and informed consent was obtained. Prior  ?                       Anticoagulants: The patient has taken no previous  ?                       anticoagulant or antiplatelet agents. ASA Grade  ?                       Assessment: II - A patient with mild systemic disease.  ?                       After reviewing the risks and benefits, the patient  ?                       was deemed in satisfactory condition to undergo the  ?                       procedure. ?                       After obtaining informed consent, the endoscope was  ?  passed under direct vision. Throughout the procedure,  ?                       the patient's blood pressure, pulse, and oxygen  ?                       saturations were monitored continuously. The  ?                       Endosonoscope was  introduced through the mouth, and  ?                       advanced to the second part of duodenum. The upper GI  ?                       endoscopy was accomplished without difficulty. The  ?                       patient tolerated the procedure well. ?Findings: ?     A medium-sized hiatal hernia was present. ?     The stomach was normal. ?     The examined duodenum was normal. ?     A TTS dilator was passed through the scope. Dilation with a 15-16.5-18  ?     mm balloon dilator was performed to 18 mm in the entire esophagus. ?Impression:            - Medium-sized hiatal hernia. ?                       - Normal stomach. ?                       - Normal examined duodenum. ?                       - Dilation performed in the entire esophagus. ?                       - No specimens collected. ?Recommendation:        - Discharge patient to home. ?                       - Resume previous diet. ?                       - Continue present medications. ?Procedure Code(s):     --- Professional --- ?                       [phone], Esophagogastroduodenoscopy, flexible,  ?                       transoral; with transendoscopic balloon dilation of  ?                       esophagus (less than 30 mm diameter) ?Diagnosis Code(s):     --- Professional --- ?                       R13.10, Dysphagia, unspecified ?CPT copyright 2019 American Medical Association. All rights reserved. ?The codes documented in this report are preliminary and upon  coder review may  ?be revised to meet current compliance requirements. ?Midge Minium MD, MD ?09/27/2021 11:48:59 AM ?This report has been signed electronically. ?Number of Addenda: 0 ?Note Initiated On: 09/27/2021 11:36 AM ?Estimated Blood Loss:  Estimated blood loss: none. ?     [facility] ?

## 2021-09-27 NOTE — Transfer of Care (Signed)
Immediate Anesthesia Transfer of Care Note ? ?Patient: Jill Crawford ? ?Procedure(s) Performed: ESOPHAGOGASTRODUODENOSCOPY (EGD) WITH PROPOFOL ? ?Patient Location: PACU ? ?Anesthesia Type:General ? ?Level of Consciousness: awake and alert  ? ?Airway & Oxygen Therapy: Patient Spontanous Breathing ? ?Post-op Assessment: Report given to RN and Post -op Vital signs reviewed and stable ? ?Post vital signs: Reviewed and stable ? ?Last Vitals:  ?Vitals Value Taken Time  ?BP 118/72 09/27/21 1154  ?Temp 35.9 ?C 09/27/21 1154  ?Pulse 83 09/27/21 1154  ?Resp 21 09/27/21 1154  ?SpO2 95 % 09/27/21 1154  ? ? ?Last Pain:  ?Vitals:  ? 09/27/21 1154  ?TempSrc: Temporal  ?   ? ?  ? ?Complications: No notable events documented. ?

## 2021-09-27 NOTE — Anesthesia Procedure Notes (Signed)
Date/Time: 09/27/2021 11:39 AM ?Performed by: Ginger Carne, CRNA ?Pre-anesthesia Checklist: Patient identified, Emergency Drugs available, Suction available, Patient being monitored and Timeout performed ?Patient Re-evaluated:Patient Re-evaluated prior to induction ?Oxygen Delivery Method: Nasal cannula ?Preoxygenation: Pre-oxygenation with 100% oxygen ?Induction Type: IV induction ? ? ? ? ?

## 2021-09-27 NOTE — Interval H&P Note (Signed)
? ?Midge Minium, MD [facility] ?[id number] ?[place], [place] [address] ?Phone:[phone] ?Fax : [phone] ? ?Primary Care Physician:  Leim Fabry, MD ?Primary Gastroenterologist:  Dr. Servando Snare ? ?Pre-Procedure History & Physical: ?HPI:  Jill Crawford is a 66 y.o. female is here for an endoscopy. ?  ?Past Medical History:  ?Diagnosis Date  ? Arthritis   ? hands  ? Eczema   ? Hypercholesteremia   ? Hypertension   ? Hypothyroidism   ? Seasonal allergies   ? ? ?Past Surgical History:  ?Procedure Laterality Date  ? CARPAL TUNNEL RELEASE    ? COLONOSCOPY WITH PROPOFOL N/A 03/20/2018  ? Procedure: COLONOSCOPY WITH PROPOFOL;  Surgeon: Scot Jun, MD;  Location: [facility] ENDOSCOPY;  Service: Endoscopy;  Laterality: N/A;  ? history of adenomatous polyp of colon    ? neuroplasty &/or transposition median    ? THYROID SURGERY    ? TONSILLECTOMY    ? ? ?Prior to Admission medications   ?Medication Sig Start Date End Date Taking? Authorizing Provider  ?ADVAIR HFA 230-21 MCG/ACT inhaler INHALE 2 PUFFS INTO THE LUNGS TWICE DAILY AS DIRECTED 09/20/20  Yes Kasa, Wallis Bamberg, MD  ?gabapentin (NEURONTIN) 300 MG capsule TAKE 3 CAPSULES BY MOUTH 3 TIMES DAILY 04/23/21  Yes Erin Fulling, MD  ?levothyroxine (SYNTHROID, LEVOTHROID) 25 MCG tablet Take 25 mcg by mouth daily before breakfast.   Yes [provider]  ?montelukast (SINGULAIR) 10 MG tablet TAKE ONE TABLET AT BEDTIME 09/18/21  Yes Kasa, Wallis Bamberg, MD  ?pantoprazole (PROTONIX) 40 MG tablet Take 1 tablet (40 mg total) by mouth daily. 09/13/21  Yes Midge Minium, MD  ?rosuvastatin (CRESTOR) 10 MG tablet Take 10 mg by mouth daily.   Yes [provider]  ?verapamil (VERELAN PM) 240 MG 24 hr capsule Take 240 mg by mouth 2 (two) times daily.   Yes [provider]  ?Acetylcarnitine HCl (ACETYL L-CARNITINE) 500 MG CAPS Take 1 capsule by mouth daily.    [provider]  ?Alpha-Lipoic Acid 200 MG CAPS Take 1 capsule by mouth daily.    [provider]  ?b complex vitamins capsule Take 1 capsule by mouth daily.    [provider]  ?Calcium Carbonate-Vitamin D (CALCIUM-VITAMIN D) 500-200 MG-UNIT per tablet Take 1 tablet by mouth 2 (two) times daily.    [provider]  ?cetirizine (ZYRTEC) 10 MG tablet Take 10 mg by mouth daily.    [provider]  ?Cholecalciferol (VITAMIN D3) 2000 UNITS TABS Take 1 tablet by mouth daily.    [provider]  ?Coenzyme Q10 (CO Q 10) 100 MG CAPS Take 1 capsule by mouth daily.    [provider]  ?Glucos-Chond-Hyal Ac-Ca Fructo (MOVE FREE JOINT HEALTH ADVANCE PO) Take 1 capsule by mouth daily.    [provider]  ?Grape Seed Extract 100 MG CAPS Take 1 capsule by mouth daily.    [provider]  ?Multiple Vitamin (MULTIVITAMIN WITH MINERALS) TABS tablet Take 1 tablet by mouth daily.    [provider]  ?olopatadine (PATANOL) 0.1 % ophthalmic solution 1 drop 2 (two) times daily.    [provider]  ?omeprazole (PRILOSEC) 40 MG capsule TAKE 1 CAPSULE EVERY DAY 09/18/21   Erin Fulling, MD  ?traMADol (ULTRAM) 50 MG tablet Take 1 tablet (50 mg total) by mouth every 6 (six) hours as needed (cough). 07/19/16   Lupita Leash, MD  ?triamcinolone (NASACORT) 55 MCG/ACT AERO nasal inhaler Place 2 sprays into the nose  daily.    [provider]  ?VENTOLIN HFA 108 (90 Base) MCG/ACT inhaler INHALE 2 PUFFS INTO THE LUNGS EVERY SIX HOURS AS NEEDED FOR WHEEZING OR SHORTNESS OF BREATH 10/04/20   Erin Fulling, MD  ?vitamin C (ASCORBIC ACID) 500 MG tablet Take 500 mg by mouth daily.    [provider]  ? ? ?Allergies as of 09/14/2021 - Review Complete 09/04/2021  ?Allergen Reaction Noted  ? Levaquin [levofloxacin in d5w] Itching 08/18/2014  ? Penicillins  08/18/2014  ? Sulfa antibiotics  08/18/2014  ? Aspirin  08/18/2014  ? ? ?Family History  ?Problem Relation Age of Onset  ? Hypertension Mother   ? Heart disease Father   ? Colon cancer Father    ? Hypertension Brother   ? Breast cancer Neg Hx   ? ? ?Social History  ? ?Socioeconomic History  ? Marital status: Single  ?  Spouse name: Not on file  ? Number of children: Not on file  ? Years of education: Not on file  ? Highest education level: Not on file  ?Occupational History  ? Not on file  ?Tobacco Use  ? Smoking status: Never  ?  Passive exposure: Past  ? Smokeless tobacco: Never  ? Tobacco comments:  ?  exposed to secondhand smoke growing up.   ?Vaping Use  ? Vaping Use: Never used  ?Substance and Sexual Activity  ? Alcohol use: No  ?  Alcohol/week: 0.0 standard drinks  ? Drug use: No  ? Sexual activity: Not on file  ?Other Topics Concern  ? Not on file  ?Social History Narrative  ? Not on file  ? ?Social Determinants of Health  ? ?Financial Resource Strain: Not on file  ?Food Insecurity: Not on file  ?Transportation Needs: Not on file  ?Physical Activity: Not on file  ?Stress: Not on file  ?Social Connections: Not on file  ?Intimate Partner Violence: Not on file  ? ? ?Review of Systems: ?See HPI, otherwise negative ROS ? ?Physical Exam: ?BP 132/67   Pulse 79   Temp (!) 96.8 ?F (36 ?C) (Temporal)   Resp 18   Ht 5\' 5"  (1.651 m)   Wt 78 kg   SpO2 97%   BMI 28.62 kg/m?  ?General:   Alert,  pleasant and cooperative in NAD ?Head:  Normocephalic and atraumatic. ?Neck:  Supple; no masses or thyromegaly. ?Lungs:  Clear throughout to auscultation.    ?Heart:  Regular rate and rhythm. ?Abdomen:  Soft, nontender and nondistended. Normal bowel sounds, without guarding, and without rebound.   ?Neurologic:  Alert and  oriented x4;  grossly normal neurologically. ? ?Impression/Plan: ? is here for an endoscopy to be performed for dysphagia ? ?Risks, benefits, limitations, and alternatives regarding  endoscopy have been reviewed with the patient.  Questions have been answered.  All parties agreeable. ? ? ?Kate Sable, MD  09/27/2021, 10:47 AM ?

## 2021-09-28 ENCOUNTER — Encounter: Payer: Self-pay | Admitting: Gastroenterology

## 2021-10-03 NOTE — Anesthesia Postprocedure Evaluation (Signed)
Anesthesia Post Note ? ?Patient: [name] ? ?Procedure(s) Performed: ESOPHAGOGASTRODUODENOSCOPY (EGD) WITH PROPOFOL ? ?Patient location during evaluation: PACU ?Anesthesia Type: General ?Level of consciousness: awake and alert ?Pain management: pain level controlled ?Vital Signs Assessment: post-procedure vital signs reviewed and stable ?Respiratory status: spontaneous breathing, nonlabored ventilation, respiratory function stable and patient connected to nasal cannula oxygen ?Cardiovascular status: blood pressure returned to baseline and stable ?Postop Assessment: no apparent nausea or vomiting ?Anesthetic complications: no ? ? ?No notable events documented. ? ? ?Last Vitals:  ?Vitals:  ? 09/27/21 1203 09/27/21 1213  ?BP: 122/78 128/78  ?Pulse: 85 85  ?Resp: 20 16  ?Temp:    ?SpO2: 95% 96%  ?  ?Last Pain:  ?Vitals:  ? 09/27/21 1203  ?TempSrc:   ?PainSc: 0-No pain  ? ? ?  ?  ?  ?  ?  ?  ? ?Yevette Edwards ? ? ? ? ?

## 2021-10-18 ENCOUNTER — Other Ambulatory Visit: Payer: Self-pay | Admitting: Internal Medicine

## 2021-10-18 DIAGNOSIS — R059 Cough, unspecified: Secondary | ICD-10-CM

## 2021-12-19 ENCOUNTER — Other Ambulatory Visit: Payer: Self-pay | Admitting: Gastroenterology

## 2022-03-20 ENCOUNTER — Other Ambulatory Visit: Payer: Self-pay | Admitting: Internal Medicine

## 2022-03-20 ENCOUNTER — Encounter: Payer: Self-pay | Admitting: Internal Medicine

## 2022-03-20 ENCOUNTER — Ambulatory Visit (INDEPENDENT_AMBULATORY_CARE_PROVIDER_SITE_OTHER): Payer: 59 | Admitting: Internal Medicine

## 2022-03-20 VITALS — BP 122/80 | HR 89 | Temp 97.6°F | Ht 65.0 in | Wt 179.6 lb

## 2022-03-20 DIAGNOSIS — J4520 Mild intermittent asthma, uncomplicated: Secondary | ICD-10-CM | POA: Diagnosis not present

## 2022-03-20 DIAGNOSIS — G4733 Obstructive sleep apnea (adult) (pediatric): Secondary | ICD-10-CM

## 2022-03-20 MED ORDER — GABAPENTIN 300 MG PO CAPS: 300.0000 mg | ORAL_CAPSULE | Freq: Three times a day (TID) | ORAL | 6 refills | Status: DC

## 2022-03-20 NOTE — Patient Instructions (Signed)
REFILL NEURONTIN FOR CHRONIC COUGH  CHANGE ADVAIR 1 PUFF TWICE DAILY AND ASSESS BREATHING  CONTINUE CPAP AS PRESCRIBED, EXCELLENT JOB A+!

## 2022-03-20 NOTE — Progress Notes (Signed)
Subjective:    Patient ID: Kate Sable, female    DOB: 1955-12-08, 66 y.o.   MRN: [id number]    Synopsis: Gregg Holster was referred to the [facility] pulmonary clinic in 2016 for evaluation of cough and dyspnea.  She had a history of recurrent bronchitis and a very minimal smoking history (less than one week in college).  PFT in 2016 was normal, CXR showed bronchitis.  08/2014 CT chest (Entrikin read)> was initially read as abnormal with some inflammatory changes but this cleared up on a repeat CT chest.  CT chest 2021 No obvious abnormality  PREVIOUS OV/SYNOPSIS Persistent chronic nonproductive cough for many years Patient has had extensive work-up with pulmonary function testing and several CT scans in the past Previous CT scan 1.5 years ago shows no obvious airways disease may be some mild interstitial lung disease Pulmonary function testing did not reveal any type of obstructive abnormality  Patient has chronic allergic rhinitis which is extensive She takes Nasacort and antihistamines with Zyrtec along with allergy eyedrops Patient has been seen in by ENT in the past  GERD seems to be under control on PPI She uses albuterol as needed She has never tried inhaled steroids  Patient is intermittently exposed to pollen and dust She lives with dogs at home She says she has a lot of dust in her house She has carpet everywhere  on Advair HFA 230 This has helped significantly as she has not used her albuterol since her last visit She has intermittent wheezing throughout the end of the day and advised to take her second dose of Advair earlier than prescribed  COVID diagnosis 3 weeks ago 07/2021 Patient requesting gabapentin  CC Follow-up OSA Follow-up reactive airways disease Follow-up chronic cough  HPI 06/2020 Home sleep study suggest AHI of 10 CPAP compliance report reviewed in detail Patient has excellent control of sleep apnea with 100% compliance  No  exacerbation at this time No evidence of heart failure at this time No evidence or signs of infection at this time No respiratory distress No fevers, chills, nausea, vomiting, diarrhea No evidence of lower extremity edema No evidence hemoptysis  Persistent cough and chronic cough subdued with gabapentin 300 mg 3 times a day Patient requesting to continue this regimen     Past Medical History:  Diagnosis Date   Arthritis    hands   Eczema    Hypercholesteremia    Hypertension    Hypothyroidism    Seasonal allergies      Review of Systems:  Gen:  Denies  fever, sweats, chills weight loss  HEENT: Denies blurred vision, double vision, ear pain, eye pain, hearing loss, nose bleeds, sore throat Cardiac:  No dizziness, chest pain or heaviness, chest tightness,edema, No JVD Resp:   + cough, -sputum production, -shortness of breath,-wheezing, -hemoptysis,  Other:  All other systems negative   BP 122/80 (BP Location: Left Arm, Cuff Size: Normal)   Pulse 89   Temp 97.6 F (36.4 C) (Temporal)   Ht 5\' 5"  (1.651 m)   Wt 179 lb 9.6 oz (81.5 kg)   SpO2 94%   BMI 29.89 kg/m    Physical Examination:   General Appearance: No distress  EYES PERRLA, EOM intact.   NECK Supple, No JVD Pulmonary: normal breath sounds, No wheezing.  CardiovascularNormal S1,S2.  No m/r/g.   ABD +subcutaneous mass mid stomach, painful to touch ALL OTHER ROS ARE NEGATIVE     PFT: October 2016 pulmonary function testing  ratio 90%, FEV1 2.43 L (93% predicted), total lung capacity 4.45 L (88% predicted), DLCO 23.92 (98% predicted). April 2017 pulmonary function testing ratio 88%, FVC 2.69 L, total lung capacity 4.83 L, DLCO 21.66 (89% predicted)  Chest imaging: April 2017 high-resolution CT scan. Findings previously worrisome for NSIP have now completely resolved. No evidence of underlying pulmonary parenchymal disease. 2018 CXR: no acute problems  CBC No results found for: "WBC", "RBC", "HGB",  "HCT", "PLT", "MCV", "MCH", "MCHC", "RDW", "LYMPHSABS", "MONOABS", "EOSABS", "BASOSABS"  Records from her most recent visit with [place] 3 to 4 weeks ago reviewed where she was seen for chronic recurrent cough, adjustments were made to her acid reflux medicines by Elisha Headland, Elavil was started 25 mg nightly      Assessment & Plan:    66 year old pleasant white female seen today for follow-up assessment for OSA underlying chronic allergic rhinitis postnasal drip with chronic cough with intermittent reactive airways disease likely related to cough variant asthma with a remote history of COVID-19 infection   Cough variant asthma  Change Advair to 1 puff twice a day and assess breathing status  Albuterol as needed  Rinse mouth after every use  Avoid triggers  Albuterol as needed   Chronic cough  Neurontin 300 mg 3 times a day works really well  Initially prescribed by Dr. Kendrick Fries and patient unable to wean off    OSA Compliance report reviewed in detail with patient Well-controlled Excellent compliance AHI reduced to 1.1 CPAP 10 Patient advised to clean her mask more often daily to prevent leaks   Chronic cough GABAPENTIN DOSING 300 mg 3 times a day   MEDICATION ADJUSTMENTS/LABS AND TESTS ORDERED: AVOID ALLERGENS Continue albuterol as Continue Zyrtec as prescribed Continue Advair HFA 1 puff twice a day Continue Singulair gabapentin 300 mg 3 times a day for chronic cough   Follow-up in 1 year  Total time spent 25 minutes  Jhoel Stieg Santiago Glad, M.D.  [facility]  Medical Director [facility] Medical Director [facility] Cardio-Pulmonary Department

## 2022-05-22 ENCOUNTER — Other Ambulatory Visit: Payer: Self-pay | Admitting: Internal Medicine

## 2022-05-31 ENCOUNTER — Other Ambulatory Visit: Payer: Self-pay | Admitting: Family Medicine

## 2022-05-31 DIAGNOSIS — Z1231 Encounter for screening mammogram for malignant neoplasm of breast: Secondary | ICD-10-CM

## 2022-06-26 ENCOUNTER — Telehealth: Payer: Self-pay | Admitting: Internal Medicine

## 2022-06-26 DIAGNOSIS — J4520 Mild intermittent asthma, uncomplicated: Secondary | ICD-10-CM

## 2022-06-26 MED ORDER — GABAPENTIN 300 MG PO CAPS: 300.0000 mg | ORAL_CAPSULE | Freq: Three times a day (TID) | ORAL | 6 refills | Status: DC

## 2022-06-26 NOTE — Telephone Encounter (Signed)
Gabapentin has been sent to preferred pharmacy. Patient is aware and voiced her understanding.  Nothing further needed.

## 2022-07-10 ENCOUNTER — Ambulatory Visit (INDEPENDENT_AMBULATORY_CARE_PROVIDER_SITE_OTHER): Payer: 59 | Admitting: Podiatry

## 2022-07-10 DIAGNOSIS — L84 Corns and callosities: Secondary | ICD-10-CM

## 2022-07-10 DIAGNOSIS — M2042 Other hammer toe(s) (acquired), left foot: Secondary | ICD-10-CM

## 2022-07-10 NOTE — Patient Instructions (Signed)
More silicone pads can be purchased from:  [email or web address]   They are also on [employer]. The ones I gave you: toe cap (slides over end of toe), silicone corn pad (beige one with pad inside), and toe separator (gel that slides between toes)

## 2022-07-12 ENCOUNTER — Ambulatory Visit
Admission: RE | Admit: 2022-07-12 | Discharge: 2022-07-12 | Disposition: A | Discharge status: Home / Self Care | Payer: 59 | Source: Ambulatory Visit | Attending: Family Medicine | Admitting: Family Medicine

## 2022-07-12 DIAGNOSIS — Z1231 Encounter for screening mammogram for malignant neoplasm of breast: Secondary | ICD-10-CM | POA: Insufficient documentation

## 2022-07-13 NOTE — Progress Notes (Signed)
  Subjective:  Patient ID: Jill Crawford, female    DOB: [birth year],  MRN: [id number]  Chief Complaint  Patient presents with   Callouses    EST - LEFT FOOT BETWEEN 4TH & 5TH TOE IS A PAINFUL BUMP    66 y.o. female presents with the above complaint. History confirmed with patient.   Objective:  Physical Exam: warm, good capillary refill, no trophic changes or ulcerative lesions, normal DP and PT pulses, normal sensory exam, and lateral fourth toe hyperkeratosis adductovarus fifth toe, pain with hammertoes.   Assessment:   1. Callus of foot   2. Hammertoe of left foot      Plan:  Patient was evaluated and treated and all questions answered.  I discussed etiology of the hammertoe deformities and treatment options with the patient including surgical and nonsurgical treatment.  Recommended offloading with silicone pads with nonoperative treatment.  She will continue this for now and return to see me as needed if she is interested in further intervention.  Return if symptoms worsen or fail to improve.

## 2022-10-17 ENCOUNTER — Other Ambulatory Visit: Payer: Self-pay | Admitting: Internal Medicine

## 2022-10-17 DIAGNOSIS — R059 Cough, unspecified: Secondary | ICD-10-CM

## 2022-11-19 ENCOUNTER — Other Ambulatory Visit: Payer: Self-pay | Admitting: Internal Medicine

## 2022-11-19 NOTE — Telephone Encounter (Signed)
Dr. Belia Heman, please advise.  Prilosec is not mentioned in last OV.

## 2023-03-21 ENCOUNTER — Other Ambulatory Visit: Payer: Self-pay | Admitting: Internal Medicine

## 2023-04-01 ENCOUNTER — Ambulatory Visit (INDEPENDENT_AMBULATORY_CARE_PROVIDER_SITE_OTHER): Payer: Managed Care, Other (non HMO)

## 2023-04-01 ENCOUNTER — Encounter: Payer: Self-pay | Admitting: Podiatry

## 2023-04-01 ENCOUNTER — Ambulatory Visit (INDEPENDENT_AMBULATORY_CARE_PROVIDER_SITE_OTHER): Payer: Managed Care, Other (non HMO) | Admitting: Podiatry

## 2023-04-01 VITALS — BP 136/78 | HR 79

## 2023-04-01 DIAGNOSIS — G5761 Lesion of plantar nerve, right lower limb: Secondary | ICD-10-CM

## 2023-04-01 DIAGNOSIS — M779 Enthesopathy, unspecified: Secondary | ICD-10-CM | POA: Diagnosis not present

## 2023-04-01 DIAGNOSIS — M7751 Other enthesopathy of right foot: Secondary | ICD-10-CM | POA: Diagnosis not present

## 2023-04-01 MED ORDER — MELOXICAM 15 MG PO TABS: 15.0000 mg | ORAL_TABLET | Freq: Every day | ORAL | 1 refills | Status: DC

## 2023-04-01 MED ORDER — METHYLPREDNISOLONE 4 MG PO TBPK: ORAL_TABLET | ORAL | 0 refills | Status: DC

## 2023-04-01 MED ORDER — BETAMETHASONE SOD PHOS & ACET 6 (3-3) MG/ML IJ SUSP
3.0000 mg | Freq: Once | INTRAMUSCULAR | Status: AC
  Administered 2023-04-01: 3 mg via INTRA_ARTICULAR

## 2023-04-01 NOTE — Addendum Note (Signed)
Addended by: Felecia Shelling on: 04/01/2023 04:44 PM   Modules accepted: Orders

## 2023-04-01 NOTE — Progress Notes (Signed)
Chief Complaint  Patient presents with   Foot Pain    "I came in December.  The place into my toes on my left foot is still sore.  I have a new problem.  On the ball of my right foot, it's real sore.  On Sunday, the pain went down into my toes and I can hardly move them and my toes are swollen." N - pain ball of foot L - 2-3 met area right D - 2 weeks O - suddenly, gotten worse C - real sore and tender A - walking, standing T - Biofreeze    HPI: 67 y.o. female presenting today for new complaint of pain and tenderness associated to the right forefoot.  Onset about 2 weeks ago.  Patient stated that she noticed some discomfort about 2 weeks ago which progressively got worse.  Over the last 2-3 days the pain has been exacerbated.  She is unable to ambulate without pain.  Presenting for further treatment evaluation.  She has not anything currently for treatment and she denies any history of injury  Past Medical History:  Diagnosis Date   Arthritis    hands   Eczema    Hypercholesteremia    Hypertension    Hypothyroidism    Seasonal allergies     Past Surgical History:  Procedure Laterality Date   CARPAL TUNNEL RELEASE     COLONOSCOPY WITH PROPOFOL N/A 03/20/2018   Procedure: COLONOSCOPY WITH PROPOFOL;  Surgeon: Scot Jun, MD;  Location: [facility] ENDOSCOPY;  Service: Endoscopy;  Laterality: N/A;   ESOPHAGOGASTRODUODENOSCOPY (EGD) WITH PROPOFOL N/A 09/27/2021   Procedure: ESOPHAGOGASTRODUODENOSCOPY (EGD) WITH PROPOFOL;  Surgeon: Midge Minium, MD;  Location: [facility] ENDOSCOPY;  Service: Endoscopy;  Laterality: N/A;   history of adenomatous polyp of colon     neuroplasty &/or transposition median     THYROID SURGERY     TONSILLECTOMY      Allergies  Allergen Reactions   Levaquin [Levofloxacin In D5w] Itching    itching   Penicillins     Serum sickness   Sulfa Antibiotics     Hives, itching, swelling   Aspirin     Sensitive in large doses     Physical Exam: General: The  patient is alert and oriented x3 in no acute distress.  Dermatology: Skin is warm, dry and supple bilateral lower extremities.   Vascular: Palpable pedal pulses bilaterally. Capillary refill within normal limits.  No appreciable edema.  No erythema.  Neurological: Grossly intact via light touch  Musculoskeletal Exam: No pedal deformities noted.  Tenderness with palpation around the second and third MTP right foot.  There is also exquisite tenderness with palpation between the second and third MTPs consistent with possible Morton's neuroma/neuritis  Radiographic Exam RT foot 04/01/2023:  Normal osseous mineralization. Joint spaces preserved.  No fractures or osseous irregularities noted.  Impression: Negative  Assessment/Plan of Care: 1.  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  Second and third MTP capsulitis right foot  -Patient evaluated.  X-rays reviewed -Injection of 0.5 cc Celestone Soluspan injected around the second and third MTP of the right foot -Prescription for Medrol Dosepak -Patient currently has an active prescription for meloxicam 15 mg daily.  Continue after completion of the Dosepak -Postsurgical shoe dispensed.  WBAT x 2-3 weeks -Return to clinic 3 weeks     Felecia Shelling, DPM [facility]  Dr. Felecia Shelling, DPM    [address]  [place], [place] [address]                Office [phone]  Fax [phone]

## 2023-04-16 ENCOUNTER — Encounter: Payer: Self-pay | Admitting: Internal Medicine

## 2023-04-16 ENCOUNTER — Ambulatory Visit (INDEPENDENT_AMBULATORY_CARE_PROVIDER_SITE_OTHER): Payer: Managed Care, Other (non HMO) | Admitting: Internal Medicine

## 2023-04-16 VITALS — BP 128/78 | HR 80 | Temp 98.4°F | Ht 65.0 in | Wt 184.6 lb

## 2023-04-16 DIAGNOSIS — J45991 Cough variant asthma: Secondary | ICD-10-CM

## 2023-04-16 DIAGNOSIS — G4733 Obstructive sleep apnea (adult) (pediatric): Secondary | ICD-10-CM

## 2023-04-16 DIAGNOSIS — R053 Chronic cough: Secondary | ICD-10-CM | POA: Diagnosis not present

## 2023-04-16 NOTE — Patient Instructions (Signed)
Excellent job with CPAP A+  Lets plan to use Advair 1 puff once a day Use albuterol as needed  Avoid secondhand smoke Avoid SICK contacts Recommend  Masking  when appropriate Recommend Keep up-to-date with vaccinations   Recommend weight loss

## 2023-04-16 NOTE — Progress Notes (Signed)
Subjective:    Patient ID: Jill Crawford, female    DOB: 1956/04/10, 67 y.o.   MRN: [id number]    Synopsis: Evadene Wardrip was referred to the [facility] pulmonary clinic in 2016 for evaluation of cough and dyspnea.  She had a history of recurrent bronchitis and a very minimal smoking history (less than one week in college).  PFT in 2016 was normal, CXR showed bronchitis.  08/2014 CT chest (Entrikin read)> was initially read as abnormal with some inflammatory changes but this cleared up on a repeat CT chest.  CT chest 2021 No obvious abnormality  PREVIOUS OV/SYNOPSIS Persistent chronic nonproductive cough for many years Patient has had extensive work-up with pulmonary function testing and several CT scans in the past Previous CT scan 1.5 years ago shows no obvious airways disease may be some mild interstitial lung disease Pulmonary function testing did not reveal any type of obstructive abnormality  Patient has chronic allergic rhinitis which is extensive She takes Nasacort and antihistamines with Zyrtec along with allergy eyedrops Patient has been seen in by ENT in the past  GERD seems to be under control on PPI She uses albuterol as needed She has never tried inhaled steroids  Patient is intermittently exposed to pollen and dust She lives with dogs at home She says she has a lot of dust in her house She has carpet everywhere  on Advair HFA 230 This has helped significantly as she has not used her albuterol since her last visit She has intermittent wheezing throughout the end of the day and advised to take her second dose of Advair earlier than prescribed  COVID diagnosis 3 weeks ago 07/2021 Patient requesting gabapentin  CC Follow-up OSA Follow-up reactive airways disease Follow-up chronic cough  HPI 06/2020 Home sleep study suggest AHI of 10 CPAP compliance report reviewed in detail Patient has excellent control of sleep apnea with 100% compliance AHI reduced  to 0.8 Currently on CPAP of 10 100% compliance  No exacerbation at this time No evidence of heart failure at this time No evidence or signs of infection at this time No respiratory distress No fevers, chills, nausea, vomiting, diarrhea No evidence of lower extremity edema No evidence hemoptysis   Persistent cough and chronic cough subdued with gabapentin 300 mg 3 times a day Patient requesting to continue this regimen   No exacerbation in the last 6 months Patient uses Advair 1 puff twice daily Lets plan to wean off Advair if tolerated  Past Medical History:  Diagnosis Date   Arthritis    hands   Eczema    Hypercholesteremia    Hypertension    Hypothyroidism    Seasonal allergies      BP 128/78 (BP Location: Right Arm, Cuff Size: Normal)   Pulse 80   Temp 98.4 F (36.9 C) (Temporal)   Ht 5\' 5"  (1.651 m)   Wt 184 lb 9.6 oz (83.7 kg)   SpO2 94%   BMI 30.72 kg/m    Review of Systems: Gen:  Denies  fever, sweats, chills weight loss  HEENT: Denies blurred vision, double vision, ear pain, eye pain, hearing loss, nose bleeds, sore throat Cardiac:  No dizziness, chest pain or heaviness, chest tightness,edema, No JVD Resp:   No cough, -sputum production, -shortness of breath,-wheezing, -hemoptysis,  Other:  All other systems negative   Physical Examination:   General Appearance: No distress  EYES PERRLA, EOM intact.   NECK Supple, No JVD Pulmonary: normal breath sounds, No  wheezing.  CardiovascularNormal S1,S2.  No m/r/g.   Abdomen: Benign, Soft, non-tender. Neurology UE/LE 5/5 strength, no focal deficits Ext pulses intact, cap refill intact ALL OTHER ROS ARE NEGATIVE      PFT: October 2016 pulmonary function testing ratio 90%, FEV1 2.43 L (93% predicted), total lung capacity 4.45 L (88% predicted), DLCO 23.92 (98% predicted). April 2017 pulmonary function testing ratio 88%, FVC 2.69 L, total lung capacity 4.83 L, DLCO 21.66 (89% predicted)  Chest  imaging: April 2017 high-resolution CT scan. Findings previously worrisome for NSIP have now completely resolved. No evidence of underlying pulmonary parenchymal disease. 2018 CXR: no acute problems  CBC No results found for: "WBC", "RBC", "HGB", "HCT", "PLT", "MCV", "MCH", "MCHC", "RDW", "LYMPHSABS", "MONOABS", "EOSABS", "BASOSABS"  Records from her most recent visit with [place] 3 to 4 weeks ago reviewed where she was seen for chronic recurrent cough, adjustments were made to her acid reflux medicines by Elisha Headland, Elavil was started 25 mg nightly      Assessment & Plan:   67 year old pleasant white female seen today for follow-up assessment for OSA with underlying chronic allergic rhinitis with postnasal drip with chronic cough and intermittent reactive airways disease likely related to cough variant asthma with remote history of COVID-19 infection  Cough variant asthma Previously change Advair was changed to 1 puff once a day Continue to use albuterol as needed Rinse mouth after every use Avoid triggers  Chronic cough Patient would like to continue with Neurontin 300 mg 3 times a day works really well Initially prescribed by Dr. Kendrick Fries and patient unable to wean off  OSA Compliance report reviewed in detail with patient Well-controlled Excellent compliance Reduced AHI 0.8   Chronic cough GABAPENTIN DOSING 300 mg 3 times a day   MEDICATION ADJUSTMENTS/LABS AND TESTS ORDERED: AVOID ALLERGENS Continue CPAP as prescribed Lets plan to use Advair 1 puff daily and then transition to as needed Continue albuterol as needed Continue Zyrtec as prescribed Continue Advair HFA 1 puff twice a day Continue Singulair gabapentin 300 mg 3 times a day for chronic cough patient wants to continue this   Follow-up in 6 months  Total time spent 32 mins  Jaziel Bennett Santiago Glad, M.D.  [facility]  Medical Director [facility] Medical Director [facility]  Cardio-Pulmonary Department

## 2023-04-22 ENCOUNTER — Ambulatory Visit (INDEPENDENT_AMBULATORY_CARE_PROVIDER_SITE_OTHER): Payer: Managed Care, Other (non HMO) | Admitting: Podiatry

## 2023-04-22 ENCOUNTER — Encounter: Payer: Self-pay | Admitting: Podiatry

## 2023-04-22 VITALS — BP 133/75 | HR 75

## 2023-04-22 DIAGNOSIS — M7751 Other enthesopathy of right foot: Secondary | ICD-10-CM | POA: Diagnosis not present

## 2023-04-22 NOTE — Progress Notes (Signed)
   No chief complaint on file.   HPI: 67 y.o. female presenting today for follow-up evaluation of second MTP capsulitis to the right forefoot.  Patient states overall she is significantly better.  She has some very slight discomfort to the ball of the foot but overall improvement.  Past Medical History:  Diagnosis Date   Arthritis    hands   Eczema    Hypercholesteremia    Hypertension    Hypothyroidism    Seasonal allergies     Past Surgical History:  Procedure Laterality Date   CARPAL TUNNEL RELEASE     COLONOSCOPY WITH PROPOFOL N/A 03/20/2018   Procedure: COLONOSCOPY WITH PROPOFOL;  Surgeon: Scot Jun, MD;  Location: [facility] ENDOSCOPY;  Service: Endoscopy;  Laterality: N/A;   ESOPHAGOGASTRODUODENOSCOPY (EGD) WITH PROPOFOL N/A 09/27/2021   Procedure: ESOPHAGOGASTRODUODENOSCOPY (EGD) WITH PROPOFOL;  Surgeon: Midge Minium, MD;  Location: [facility] ENDOSCOPY;  Service: Endoscopy;  Laterality: N/A;   history of adenomatous polyp of colon     neuroplasty &/or transposition median     THYROID SURGERY     TONSILLECTOMY      Allergies  Allergen Reactions   Levaquin [Levofloxacin In D5w] Itching    itching   Penicillins     Serum sickness   Sulfa Antibiotics     Hives, itching, swelling   Aspirin     Sensitive in large doses     Physical Exam: General: The patient is alert and oriented x3 in no acute distress.  Dermatology: Skin is warm, dry and supple bilateral lower extremities.   Vascular: Palpable pedal pulses bilaterally. Capillary refill within normal limits.  No appreciable edema.  No erythema.  Neurological: Grossly intact via light touch  Musculoskeletal Exam: No pedal deformities noted.  Minimal tenderness with palpation around the second and third MTP right foot plantar.    Radiographic Exam RT foot 04/01/2023:  Normal osseous mineralization. Joint spaces preserved.  No fractures or osseous irregularities noted.  Impression: Negative  Assessment/Plan of  Care: 1.  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  Second and third MTP capsulitis right foot  -Patient evaluated.   -Continue meloxicam 15 mg daily as needed -Offloading felt metatarsal pads were applied to the insoles of the patient's right shoe to offload pressure from the second and third MTP -Recommend good supportive tennis shoes or sneakers -Advised against going barefoot -Return to clinic as needed   Felecia Shelling, DPM [facility]  Dr. Felecia Shelling, DPM    [address] [place], [place] [address]                Office [phone]  Fax [phone]

## 2023-05-15 ENCOUNTER — Telehealth: Payer: Self-pay | Admitting: Gastroenterology

## 2023-05-15 NOTE — Telephone Encounter (Signed)
Patient called in to schedule her colonoscopy.

## 2023-06-05 ENCOUNTER — Other Ambulatory Visit: Payer: Self-pay | Admitting: Internal Medicine

## 2023-06-10 ENCOUNTER — Other Ambulatory Visit: Payer: Self-pay | Admitting: Family Medicine

## 2023-06-10 DIAGNOSIS — Z1231 Encounter for screening mammogram for malignant neoplasm of breast: Secondary | ICD-10-CM

## 2023-07-14 ENCOUNTER — Ambulatory Visit
Admission: RE | Admit: 2023-07-14 | Discharge: 2023-07-14 | Disposition: A | Discharge status: Home / Self Care | Payer: Managed Care, Other (non HMO) | Source: Ambulatory Visit | Attending: Family Medicine | Admitting: Family Medicine

## 2023-07-14 DIAGNOSIS — Z1231 Encounter for screening mammogram for malignant neoplasm of breast: Secondary | ICD-10-CM | POA: Insufficient documentation

## 2023-07-27 ENCOUNTER — Other Ambulatory Visit: Payer: Self-pay | Admitting: Podiatry

## 2023-08-14 ENCOUNTER — Other Ambulatory Visit: Payer: Self-pay | Admitting: Internal Medicine

## 2023-08-14 DIAGNOSIS — J4520 Mild intermittent asthma, uncomplicated: Secondary | ICD-10-CM

## 2023-08-26 ENCOUNTER — Other Ambulatory Visit: Payer: Self-pay | Admitting: Family Medicine

## 2023-08-26 DIAGNOSIS — Z780 Asymptomatic menopausal state: Secondary | ICD-10-CM

## 2023-09-03 ENCOUNTER — Other Ambulatory Visit: Payer: Self-pay | Admitting: Internal Medicine

## 2023-09-13 ENCOUNTER — Other Ambulatory Visit: Payer: Self-pay | Admitting: Medical Genetics

## 2023-09-15 ENCOUNTER — Other Ambulatory Visit
Admission: RE | Admit: 2023-09-15 | Discharge: 2023-09-15 | Disposition: A | Discharge status: Home / Self Care | Source: Ambulatory Visit | Attending: Medical Genetics | Admitting: Medical Genetics

## 2023-09-29 LAB — GENECONNECT MOLECULAR SCREEN: Genetic Analysis Overall Interpretation: NEGATIVE

## 2023-10-01 ENCOUNTER — Other Ambulatory Visit: Payer: Self-pay | Admitting: Internal Medicine

## 2023-10-13 ENCOUNTER — Ambulatory Visit (INDEPENDENT_AMBULATORY_CARE_PROVIDER_SITE_OTHER): Payer: Managed Care, Other (non HMO) | Admitting: Internal Medicine

## 2023-10-13 ENCOUNTER — Encounter: Payer: Self-pay | Admitting: Internal Medicine

## 2023-10-13 VITALS — BP 126/70 | HR 78 | Temp 97.6°F | Ht 65.0 in | Wt 190.4 lb

## 2023-10-13 DIAGNOSIS — G4733 Obstructive sleep apnea (adult) (pediatric): Secondary | ICD-10-CM | POA: Diagnosis not present

## 2023-10-13 DIAGNOSIS — J45991 Cough variant asthma: Secondary | ICD-10-CM

## 2023-10-13 DIAGNOSIS — R059 Cough, unspecified: Secondary | ICD-10-CM

## 2023-10-13 MED ORDER — ALBUTEROL SULFATE HFA 108 (90 BASE) MCG/ACT IN AERS: 2.0000 | INHALATION_SPRAY | Freq: Four times a day (QID) | RESPIRATORY_TRACT | 3 refills | Status: AC | PRN

## 2023-10-13 NOTE — Progress Notes (Signed)
 Subjective:    Patient ID: Jill Crawford, female    DOB: [birth year], 68 y.o.   MRN: [id number]    Synopsis: Mallorey Odonell was referred to the [facility] pulmonary clinic in 2016 for evaluation of cough and dyspnea.  She had a history of recurrent bronchitis and a very minimal smoking history (less than one week in college).  PFT in 2016 was normal, CXR showed bronchitis.  08/2014 CT chest (Entrikin read)> was initially read as abnormal with some inflammatory changes but this cleared up on a repeat CT chest.  CT chest 2021 No obvious abnormality  PREVIOUS OV/SYNOPSIS Persistent chronic nonproductive cough for many years Patient has had extensive work-up with pulmonary function testing and several CT scans in the past Previous CT scan 1.5 years ago shows no obvious airways disease may be some mild interstitial lung disease Pulmonary function testing did not reveal any type of obstructive abnormality  Patient has chronic allergic rhinitis which is extensive She takes Nasacort and antihistamines with Zyrtec along with allergy eyedrops Patient has been seen in by ENT in the past  GERD seems to be under control on PPI She uses albuterol as needed She has never tried inhaled steroids  Patient is intermittently exposed to pollen and dust She lives with dogs at home She says she has a lot of dust in her house She has carpet everywhere  on Advair HFA 230 This has helped significantly as she has not used her albuterol since her last visit She has intermittent wheezing throughout the end of the day and advised to take her second dose of Advair earlier than prescribed  COVID diagnosis 3 weeks ago 07/2021 Patient requesting gabapentin  CC Follow-up assessment for OSA Follow-up assessment for reactive airways disease Follow-up chronic cough  HPI Regarding OSA December 2021 HST AHI of 10 Compliance download reviewed in detail today AHI significant reduced Patient uses and  benefits from therapy Using CPAP nightly and with naps Pressure setting is comfortable and is sleeping well. CPAP 10 100% compliance AHI reduced to 1  No exacerbation at this time No evidence of heart failure at this time No evidence or signs of infection at this time No respiratory distress No fevers, chills, nausea, vomiting, diarrhea No evidence of lower extremity edema No evidence hemoptysis  Persistent cough and chronic cough subdued with gabapentin 300 mg 3 times a day Patient requesting to continue this regimen Patient was started on Advair this seems to be helping her cough   No exacerbations in the last 6 months   Past Medical History:  Diagnosis Date   Arthritis    hands   Eczema    Hypercholesteremia    Hypertension    Hypothyroidism    Seasonal allergies    BP 126/70 (BP Location: Left Arm, Patient Position: Sitting, Cuff Size: Normal)   Pulse 78   Temp 97.6 F (36.4 C) (Temporal)   Ht 5\' 5"  (1.651 m)   Wt 190 lb 6.4 oz (86.4 kg)   SpO2 95%   BMI 31.68 kg/m     Review of Systems: Gen:  Denies  fever, sweats, chills weight loss  HEENT: Denies blurred vision, double vision, ear pain, eye pain, hearing loss, nose bleeds, sore throat Cardiac:  No dizziness, chest pain or heaviness, chest tightness,edema, No JVD Resp:   No cough, -sputum production, -shortness of breath,-wheezing, -hemoptysis,  Other:  All other systems negative   Physical Examination:   General Appearance: No distress  EYES PERRLA,  EOM intact.   NECK Supple, No JVD Pulmonary: normal breath sounds, No wheezing.  CardiovascularNormal S1,S2.  No m/r/g.   Abdomen: Benign, Soft, non-tender. Neurology UE/LE 5/5 strength, no focal deficits Ext pulses intact, cap refill intact ALL OTHER ROS ARE NEGATIVE       PFT: October 2016 pulmonary function testing ratio 90%, FEV1 2.43 L (93% predicted), total lung capacity 4.45 L (88% predicted), DLCO 23.92 (98% predicted). April 2017  pulmonary function testing ratio 88%, FVC 2.69 L, total lung capacity 4.83 L, DLCO 21.66 (89% predicted) Reviewing previous pulmonary function test no restrictive or obstructive process noted  Chest imaging: April 2017 high-resolution CT scan. Findings previously worrisome for NSIP have now completely resolved. No evidence of underlying pulmonary parenchymal disease. 2018 CXR: no acute problems      Assessment & Plan:   68 year old pleasant white female seen today for follow-up assessment for OSA underlying chronic allergic rhinitis with postnasal drip upper airway cough syndrome with chronic cough with intermittent reactive airways disease cough variant asthma with remote history of COVID-19 infection   Assessment of Sleep apnea Patient has excellent compliance report Discussed in detail with patient OSA is well-controlled with CPAP Continue current prescription Patient uses and benefits from therapy Using CPAP nightly.  pressure setting is comfortable and she is sleeping well. CPAP 10 AHI reduced to 1  Patient Instructions Continue to use CPAP every night, minimum of 4-6 hours a night.  Change equipment every 30 days or as directed by DME.  Wash your tubing with warm soap and water daily, hang to dry. Wash humidifier portion weekly. Use bottled, distilled water and change daily   Be aware of reduced alertness and do not drive or operate heavy machinery if experiencing this or drowsiness.  Exercise encouraged, as tolerated. Encouraged proper weight management.  Important to get eight or more hours of sleep  Limiting the use of the computer and television before bedtime.  Decrease naps during the day, so night time sleep will become enhanced.  Limit caffeine, and sleep deprivation.  HTN, stroke, uncontrolled diabetes and heart failure are potential risk factors.  Risk of untreated sleep apnea including cardiac arrhthymias, stroke, DM, pulm HTN.   Cough variant asthma Continue  Advair 1 puff once a day Albuterol as needed Please rinse mouth after every use Avoid Allergens and Irritants Avoid secondhand smoke Avoid SICK contacts Recommend  Masking  when appropriate Recommend Keep up-to-date with vaccinations  Chronic cough Patient was started on Neurontin 300 mg 3 times a day by Dr. Kendrick Fries Patient does not want to wean off gabapentin Patient states this really works well for her initially prescribed by Dr. Kendrick Fries and patient unable to wean off    Chronic cough GABAPENTIN DOSING 300 mg 3 times a day   MEDICATION ADJUSTMENTS/LABS AND TESTS ORDERED: Excellent job with CPAP Continue CPAP as prescribed Continue to use inhalers as prescribed Rinse mouth after every use for Advair Gabapentin 300 mg 3 times a day for chronic cough Patient wants to continue this Avoid Allergens and Irritants Avoid secondhand smoke Avoid SICK contacts Recommend  Masking  when appropriate Recommend Keep up-to-date with vaccinations   Follow-up in 6 months    Total time spent 44 mins  Munachimso Palin Santiago Glad, M.D.  [facility]  Medical Director [facility] Medical Director [facility] Cardio-Pulmonary Department

## 2023-10-13 NOTE — Patient Instructions (Addendum)
 90-day supply refill albuterol  Excellent Job A+ GOLD STAR!!  Continue CPAP as prescribed  Avoid Allergens and Irritants Avoid secondhand smoke Avoid SICK contacts Recommend  Masking  when appropriate Recommend Keep up-to-date with vaccinations   Be aware of reduced alertness and do not drive or operate heavy machinery if experiencing this or drowsiness.  Exercise encouraged, as tolerated. Encouraged proper weight management.  Important to get eight or more hours of sleep  Limiting the use of the computer and television before bedtime.  Decrease naps during the day, so night time sleep will become enhanced.  Limit caffeine, and sleep deprivation.    Avoid Allergens and Irritants Avoid secondhand smoke Avoid SICK contacts Recommend  Masking  when appropriate Recommend Keep up-to-date with vaccinations Continue to use inhalers as prescribed  Follow-up with ENT doctor for thyroid problems

## 2023-11-07 ENCOUNTER — Other Ambulatory Visit: Payer: Self-pay | Admitting: Family Medicine

## 2023-11-07 DIAGNOSIS — R059 Cough, unspecified: Secondary | ICD-10-CM

## 2023-11-07 NOTE — Telephone Encounter (Signed)
 Last Fill: 10/17/22    Routing to provider for review/authorization.

## 2023-11-07 NOTE — Telephone Encounter (Signed)
 Copied from CRM [id number]. Topic: Clinical - Medication Refill >> Nov 07, 2023 10:51 AM [name] wrote: Most Recent Primary Care Visit:   Medication: ADVAIR  HFA 230-21 MCG/ACT inhaler  Has the patient contacted their pharmacy? Yes Pt states she has called them for a week.  So they told her she needs to reach out to her dr office Is this the correct pharmacy for this prescription? Yes If no, delete pharmacy and type the correct one.  This is the patient's preferred pharmacy:  [id number] - [place], [place] - [address] [place] [address] Phone: [phone] Fax: [phone]   Has the prescription been filled recently? Yes  Is the patient out of the medication? Yes  Has the patient been seen for an appointment in the last year OR does the patient have an upcoming appointment? Yes  Can we respond through MyChart? No  Agent: Please be advised that Rx refills may take up to 3 business days. We ask that you follow-up with your pharmacy.

## 2023-11-10 MED ORDER — FLUTICASONE-SALMETEROL 230-21 MCG/ACT IN AERO: 2.0000 | INHALATION_SPRAY | Freq: Two times a day (BID) | RESPIRATORY_TRACT | 12 refills | Status: DC

## 2023-11-28 ENCOUNTER — Other Ambulatory Visit: Payer: Self-pay | Admitting: Podiatry

## 2023-12-03 ENCOUNTER — Other Ambulatory Visit: Payer: Self-pay | Admitting: Podiatry

## 2023-12-05 ENCOUNTER — Other Ambulatory Visit: Payer: Self-pay | Admitting: Internal Medicine

## 2023-12-05 DIAGNOSIS — R12 Heartburn: Secondary | ICD-10-CM

## 2023-12-05 DIAGNOSIS — R1319 Other dysphagia: Secondary | ICD-10-CM

## 2023-12-05 MED ORDER — OMEPRAZOLE 40 MG PO CPDR: 40.0000 mg | DELAYED_RELEASE_CAPSULE | Freq: Every day | ORAL | 3 refills | Status: DC

## 2023-12-06 ENCOUNTER — Other Ambulatory Visit: Payer: Self-pay | Admitting: Podiatry

## 2024-01-26 ENCOUNTER — Other Ambulatory Visit: Payer: Self-pay | Admitting: Internal Medicine

## 2024-01-26 DIAGNOSIS — R059 Cough, unspecified: Secondary | ICD-10-CM

## 2024-01-26 DIAGNOSIS — J4520 Mild intermittent asthma, uncomplicated: Secondary | ICD-10-CM

## 2024-02-23 ENCOUNTER — Other Ambulatory Visit: Payer: Self-pay | Admitting: Internal Medicine

## 2024-02-23 DIAGNOSIS — R1319 Other dysphagia: Secondary | ICD-10-CM

## 2024-02-23 DIAGNOSIS — R12 Heartburn: Secondary | ICD-10-CM

## 2024-02-23 DIAGNOSIS — J4520 Mild intermittent asthma, uncomplicated: Secondary | ICD-10-CM

## 2024-03-23 ENCOUNTER — Other Ambulatory Visit: Payer: Self-pay | Admitting: Internal Medicine

## 2024-03-23 DIAGNOSIS — R059 Cough, unspecified: Secondary | ICD-10-CM

## 2024-03-31 ENCOUNTER — Ambulatory Visit
Admission: RE | Admit: 2024-03-31 | Discharge: 2024-03-31 | Disposition: A | Discharge status: Home / Self Care | Source: Ambulatory Visit | Attending: Family Medicine | Admitting: Family Medicine

## 2024-03-31 DIAGNOSIS — Z780 Asymptomatic menopausal state: Secondary | ICD-10-CM | POA: Diagnosis present

## 2024-04-22 ENCOUNTER — Encounter: Payer: Self-pay | Admitting: Internal Medicine

## 2024-04-22 ENCOUNTER — Ambulatory Visit: Admitting: Internal Medicine

## 2024-04-22 VITALS — BP 126/80 | HR 76 | Temp 98.3°F | Ht 65.0 in | Wt 188.2 lb

## 2024-04-22 DIAGNOSIS — J45991 Cough variant asthma: Secondary | ICD-10-CM | POA: Diagnosis not present

## 2024-04-22 DIAGNOSIS — J4520 Mild intermittent asthma, uncomplicated: Secondary | ICD-10-CM

## 2024-04-22 DIAGNOSIS — R053 Chronic cough: Secondary | ICD-10-CM | POA: Diagnosis not present

## 2024-04-22 DIAGNOSIS — G4733 Obstructive sleep apnea (adult) (pediatric): Secondary | ICD-10-CM

## 2024-04-22 DIAGNOSIS — R059 Cough, unspecified: Secondary | ICD-10-CM

## 2024-04-22 MED ORDER — FLUTICASONE-SALMETEROL 230-21 MCG/ACT IN AERO: 2.0000 | INHALATION_SPRAY | Freq: Two times a day (BID) | RESPIRATORY_TRACT | 3 refills | Status: AC

## 2024-04-22 MED ORDER — GABAPENTIN 300 MG PO CAPS
300.0000 mg | ORAL_CAPSULE | Freq: Three times a day (TID) | ORAL | 3 refills | Status: AC
Start: 2024-04-22 — End: ?

## 2024-04-22 NOTE — Patient Instructions (Addendum)
 Excellent Job A+ GOLD STAR!!  Continue CPAP as prescribed  Patient Instructions Continue to use CPAP every night, minimum of 4-6 hours a night.  Change equipment every 30 days or as directed by DME.  Wash your tubing with warm soap and water daily, hang to dry. Wash humidifier portion weekly. Use bottled, distilled water and change daily   Be aware of reduced alertness and do not drive or operate heavy machinery if experiencing this or drowsiness.  Exercise encouraged, as tolerated. Encouraged proper weight management.  Important to get eight or more hours of sleep  Limiting the use of the computer and television before bedtime.  Decrease naps during the day, so night time sleep will become enhanced.  Limit caffeine, and sleep deprivation.    Avoid Allergens and Irritants Avoid secondhand smoke Avoid SICK contacts Recommend  Masking  when appropriate Recommend Keep up-to-date with vaccinations  Continue inhalers as prescribed Continue gabapentin  as requested

## 2024-04-22 NOTE — Progress Notes (Signed)
 Subjective:    Patient ID: Jill Crawford, female    DOB: 05/02/1956, 68 y.o.   MRN: [id number]    Synopsis: Everest Hacking was referred to the [facility] pulmonary clinic in 2016 for evaluation of cough and dyspnea.  She had a history of recurrent bronchitis and a very minimal smoking history (less than one week in college).  PFT in 2016 was normal, CXR showed bronchitis.  08/2014 CT chest (Entrikin read)> was initially read as abnormal with some inflammatory changes but this cleared up on a repeat CT chest.  CT chest 2021 No obvious abnormality  PREVIOUS OV/SYNOPSIS Persistent chronic nonproductive cough for many years Patient has had extensive work-up with pulmonary function testing and several CT scans in the past Previous CT scan 1.5 years ago shows no obvious airways disease may be some mild interstitial lung disease Pulmonary function testing did not reveal any type of obstructive abnormality  Patient has chronic allergic rhinitis which is extensive She takes Nasacort and antihistamines with Zyrtec along with allergy eyedrops Patient has been seen in by ENT in the past  GERD seems to be under control on PPI She uses albuterol  as needed She has never tried inhaled steroids  Patient is intermittently exposed to pollen and dust She lives with dogs at home She says she has a lot of dust in her house She has carpet everywhere  on Advair  HFA 230 This has helped significantly as she has not used her albuterol  since her last visit She has intermittent wheezing throughout the end of the day and advised to take her second dose of Advair  earlier than prescribed  COVID diagnosis 3 weeks ago 07/2021 Patient requesting gabapentin   CC Follow up assessment of OSA Follow up assessment of Cough variant ASTHMA   HPI Regarding OSA Assessment of OSA Previous AHI 23 Jun 2020 Continue CPAP as prescribed  Excellent compliance report Reviewed compliance report in detail with  patient Patient definitely benefits the use of CPAP therapy as prescribed Using CPAP nightly and with naps Pressure setting is comfortable and is sleeping well. CPAP prescription 10 AHI reduced to  No exacerbation at this time No evidence of heart failure at this time No evidence or signs of infection at this time No respiratory distress No fevers, chills, nausea, vomiting, diarrhea No evidence of lower extremity edema No evidence hemoptysis    Persistent cough and chronic cough subdued with gabapentin  300 mg 3 times a day Patient requesting to continue this regimen despite potential reactions and end organ damage Patient was started on Advair  this seems to be helping her cough   No exacerbations in the last 6 months   Past Medical History:  Diagnosis Date   Arthritis    hands   Eczema    Hypercholesteremia    Hypertension    Hypothyroidism    Seasonal allergies    BP 126/80   Pulse 76   Temp 98.3 F (36.8 C)   Ht 5' 5 (1.651 m)   Wt 188 lb 3.2 oz (85.4 kg)   SpO2 94%   BMI 31.32 kg/m   Review of Systems: Gen:  Denies  fever, sweats, chills weight loss  HEENT: Denies blurred vision, double vision, ear pain, eye pain, hearing loss, nose bleeds, sore throat Cardiac:  No dizziness, chest pain or heaviness, chest tightness,edema, No JVD Resp:   No cough, -sputum production, -shortness of breath,-wheezing, -hemoptysis,  Other:  All other systems negative   Physical Examination:   General  Appearance: No distress  EYES PERRLA, EOM intact.   NECK Supple, No JVD Pulmonary: normal breath sounds, No wheezing.  CardiovascularNormal S1,S2.  No m/r/g.   Abdomen: Benign, Soft, non-tender. Neurology UE/LE 5/5 strength, no focal deficits Ext pulses intact, cap refill intact ALL OTHER ROS ARE NEGATIVE       PFT: October 2016 pulmonary function testing ratio 90%, FEV1 2.43 L (93% predicted), total lung capacity 4.45 L (88% predicted), DLCO 23.92 (98%  predicted). April 2017 pulmonary function testing ratio 88%, FVC 2.69 L, total lung capacity 4.83 L, DLCO 21.66 (89% predicted) Reviewing previous pulmonary function test no restrictive or obstructive process noted  Chest imaging: April 2017 high-resolution CT scan. Findings previously worrisome for NSIP have now completely resolved. No evidence of underlying pulmonary parenchymal disease. 2018 CXR: no acute problems      Assessment & Plan:   68 year old pleasant white female seen today for follow-up assessment for OSA underlying chronic allergic rhinitis with postnasal drip upper airway cough syndrome with chronic cough with intermittent reactive airways disease cough variant asthma with remote history of COVID-19 infection    Assessment of OSA Previous AHI 10 Continue CPAP as prescribed  Excellent compliance report Reviewed compliance report in detail with patient Patient definitely benefits the use of CPAP therapy as prescribed Using CPAP nightly and with naps Pressure setting is comfortable and is sleeping well. CPAP prescription 10 AHI reduced to 0.1  No evidence of acute heart failure at this time No respiratory distress No fevers, chills, nausea, vomiting, diarrhea No evidence hemoptysis  Patient Instructions Continue to use CPAP every night, minimum of 4-6 hours a night.  Change equipment every 30 days or as directed by DME.  Wash your tubing with warm soap and water daily, hang to dry. Wash humidifier portion weekly. Use bottled, distilled water and change daily   Be aware of reduced alertness and do not drive or operate heavy machinery if experiencing this or drowsiness.  Exercise encouraged, as tolerated. Encouraged proper weight management.  Important to get eight or more hours of sleep  Limiting the use of the computer and television before bedtime.  Decrease naps during the day, so night time sleep will become enhanced.  Limit caffeine, and sleep deprivation.   HTN, stroke, uncontrolled diabetes and heart failure are potential risk factors.  Risk of untreated sleep apnea including cardiac arrhthymias, stroke, DM, pulm HTN.   Cough variant asthma Continue Advair  1 puff once a day Albuterol  as needed Please rinse mouth after every use Avoid Allergens and Irritants Avoid secondhand smoke Avoid SICK contacts Recommend  Masking  when appropriate Recommend Keep up-to-date with vaccinations   Chronic cough Patient was started on Neurontin  300 mg 3 times a day by Dr. Alaine Patient does NOT want to wean off gabapentin  Patient states this really works well for her initially prescribed by Dr. Alaine and patient unable to wean off GABAPENTIN  DOSING 300 mg 3 times a day   MEDICATION ADJUSTMENTS/LABS AND TESTS ORDERED: Continue CPAP as prescribed Continue to use inhalers as prescribed Rinse mouth after every use for Advair  Gabapentin  300 mg 3 times a day for chronic cough Patient wants to continue this Avoid Allergens and Irritants Avoid secondhand smoke Avoid SICK contacts Recommend  Masking  when appropriate Recommend Keep up-to-date with vaccinations   MEDICATION ADJUSTMENTS/LABS AND TESTS ORDERED:    CURRENT MEDICATIONS REVIEWED AT LENGTH WITH PATIENT TODAY   Patient  satisfied with Plan of action and management. All questions answered  Follow up 1 year   I spent a total of 48 minutes dedicated to the care of this patient on the date of this encounter to include pre-visit review of records, face-to-face time with the patient discussing conditions above, post visit ordering of testing, clinical documentation with the electronic health record, making appropriate referrals as documented, and communicating necessary information to the patient's healthcare team.    The Patient requires high complexity decision making for assessment and support, frequent evaluation and titration of therapies, application of advanced monitoring  technologies and extensive interpretation of multiple databases.  Patient satisfied with Plan of action and management. All questions answered    Nickolas Alm Cellar, M.D.  [facility] Pulmonary & Critical Care Medicine  Medical Director [facility] [place]

## 2024-05-28 ENCOUNTER — Other Ambulatory Visit: Payer: Self-pay | Admitting: Physical Medicine & Rehabilitation

## 2024-05-28 DIAGNOSIS — M5416 Radiculopathy, lumbar region: Secondary | ICD-10-CM

## 2024-05-31 ENCOUNTER — Other Ambulatory Visit: Payer: Self-pay | Admitting: Family Medicine

## 2024-05-31 DIAGNOSIS — Z1231 Encounter for screening mammogram for malignant neoplasm of breast: Secondary | ICD-10-CM

## 2024-06-03 ENCOUNTER — Ambulatory Visit
Admission: RE | Admit: 2024-06-03 | Discharge: 2024-06-03 | Disposition: A | Discharge status: Home / Self Care | Source: Ambulatory Visit | Attending: Physical Medicine & Rehabilitation | Admitting: Physical Medicine & Rehabilitation

## 2024-06-03 DIAGNOSIS — M5416 Radiculopathy, lumbar region: Secondary | ICD-10-CM

## 2024-07-14 ENCOUNTER — Ambulatory Visit
Admission: RE | Admit: 2024-07-14 | Discharge: 2024-07-14 | Disposition: A | Discharge status: Home / Self Care | Source: Ambulatory Visit | Attending: Family Medicine | Admitting: Family Medicine

## 2024-07-14 DIAGNOSIS — Z1231 Encounter for screening mammogram for malignant neoplasm of breast: Secondary | ICD-10-CM | POA: Diagnosis present

## 2024-07-26 ENCOUNTER — Telehealth: Payer: Self-pay

## 2024-07-26 NOTE — Telephone Encounter (Signed)
 Copied from CRM #[id number]. Topic: Appointments - Scheduling Inquiry for Clinic >> Jul 26, 2024 10:39 AM Sophia H wrote: Reason for CRM: Patient states she was referred over to Dr. Marylynn by Dr. Isaiah [facility]), did advise Dr. Lula books are currently closed but patient requested I ask if she would be willing given the recommendation by Dr. Isaiah.   Please advise # [phone]

## 2024-07-27 NOTE — Telephone Encounter (Signed)
 Please scheduled pt for a new pt appt with Dr. Tullo.

## 2024-08-02 NOTE — Telephone Encounter (Signed)
 noted

## 2024-09-24 ENCOUNTER — Ambulatory Visit: Admitting: Internal Medicine

## 2024-11-24 ENCOUNTER — Encounter: Admitting: Internal Medicine
